# Patient Record
Sex: Male | Born: 1935 | Race: White | Hispanic: No | Marital: Married | Smoking: Former smoker
Health system: Southern US, Community
[De-identification: ages and names within clinical notes are randomized; demographics above are authoritative.]

## PROBLEM LIST (undated history)

## (undated) DIAGNOSIS — I10 Essential (primary) hypertension: Secondary | ICD-10-CM

## (undated) DIAGNOSIS — I6529 Occlusion and stenosis of unspecified carotid artery: Secondary | ICD-10-CM

## (undated) DIAGNOSIS — I2510 Atherosclerotic heart disease of native coronary artery without angina pectoris: Secondary | ICD-10-CM

## (undated) DIAGNOSIS — E785 Hyperlipidemia, unspecified: Secondary | ICD-10-CM

## (undated) HISTORY — DX: Occlusion and stenosis of unspecified carotid artery: I65.29

## (undated) HISTORY — PX: CARDIAC CATHETERIZATION: SHX172

## (undated) HISTORY — DX: Essential (primary) hypertension: I10

## (undated) HISTORY — PX: CORONARY ARTERY BYPASS GRAFT: SHX141

## (undated) HISTORY — PX: KNEE ARTHROPLASTY: SHX992

## (undated) HISTORY — DX: Atherosclerotic heart disease of native coronary artery without angina pectoris: I25.10

## (undated) HISTORY — DX: Hyperlipidemia, unspecified: E78.5

## (undated) HISTORY — PX: CAROTID ENDARTERECTOMY: SUR193

---

## 2009-11-15 ENCOUNTER — Inpatient Hospital Stay: Payer: Self-pay | Admitting: Internal Medicine

## 2009-11-15 ENCOUNTER — Ambulatory Visit: Payer: Self-pay | Admitting: Cardiovascular Disease

## 2009-11-16 ENCOUNTER — Encounter: Payer: Self-pay | Admitting: Cardiovascular Disease

## 2009-11-18 ENCOUNTER — Ambulatory Visit: Payer: Self-pay | Admitting: Cardiovascular Disease

## 2009-11-18 LAB — CONVERTED CEMR LAB
INR: 1.11 (ref <1.50)
Prothrombin Time: 14.2 s (ref 11.6–15.2)

## 2009-11-20 ENCOUNTER — Ambulatory Visit: Payer: Self-pay | Admitting: Cardiovascular Disease

## 2009-11-21 ENCOUNTER — Ambulatory Visit: Payer: Self-pay | Admitting: Cardiovascular Disease

## 2009-11-21 ENCOUNTER — Ambulatory Visit: Payer: Self-pay

## 2009-11-21 ENCOUNTER — Telehealth: Payer: Self-pay | Admitting: Cardiovascular Disease

## 2009-11-21 DIAGNOSIS — I25810 Atherosclerosis of coronary artery bypass graft(s) without angina pectoris: Secondary | ICD-10-CM

## 2009-11-21 DIAGNOSIS — R0989 Other specified symptoms and signs involving the circulatory and respiratory systems: Secondary | ICD-10-CM

## 2009-11-21 DIAGNOSIS — I6529 Occlusion and stenosis of unspecified carotid artery: Secondary | ICD-10-CM

## 2009-11-22 ENCOUNTER — Ambulatory Visit: Payer: Self-pay | Admitting: Vascular Surgery

## 2009-11-25 ENCOUNTER — Ambulatory Visit: Payer: Self-pay | Admitting: Cardiothoracic Surgery

## 2009-11-26 ENCOUNTER — Ambulatory Visit (HOSPITAL_COMMUNITY): Admission: RE | Admit: 2009-11-26 | Discharge: 2009-11-26 | Payer: Self-pay | Admitting: Cardiothoracic Surgery

## 2009-11-26 ENCOUNTER — Encounter: Payer: Self-pay | Admitting: Cardiothoracic Surgery

## 2009-11-28 ENCOUNTER — Inpatient Hospital Stay (HOSPITAL_COMMUNITY): Admission: RE | Admit: 2009-11-28 | Discharge: 2009-12-09 | Payer: Self-pay | Admitting: Cardiothoracic Surgery

## 2009-11-28 ENCOUNTER — Ambulatory Visit: Payer: Self-pay | Admitting: Cardiothoracic Surgery

## 2009-11-28 ENCOUNTER — Encounter: Payer: Self-pay | Admitting: Cardiothoracic Surgery

## 2009-12-17 ENCOUNTER — Ambulatory Visit: Payer: Self-pay | Admitting: Cardiovascular Disease

## 2009-12-17 DIAGNOSIS — E785 Hyperlipidemia, unspecified: Secondary | ICD-10-CM | POA: Insufficient documentation

## 2009-12-25 ENCOUNTER — Ambulatory Visit: Payer: Self-pay | Admitting: Cardiothoracic Surgery

## 2009-12-25 ENCOUNTER — Encounter: Admission: RE | Admit: 2009-12-25 | Discharge: 2009-12-25 | Payer: Self-pay | Admitting: Cardiothoracic Surgery

## 2009-12-31 ENCOUNTER — Telehealth: Payer: Self-pay | Admitting: Cardiovascular Disease

## 2010-01-14 ENCOUNTER — Telehealth: Payer: Self-pay | Admitting: Cardiovascular Disease

## 2010-01-29 ENCOUNTER — Telehealth: Payer: Self-pay | Admitting: Cardiovascular Disease

## 2010-02-04 ENCOUNTER — Ambulatory Visit: Payer: Self-pay | Admitting: Vascular Surgery

## 2010-02-19 ENCOUNTER — Ambulatory Visit: Payer: Self-pay | Admitting: Cardiothoracic Surgery

## 2010-03-03 ENCOUNTER — Encounter: Payer: Self-pay | Admitting: Cardiovascular Disease

## 2010-03-10 ENCOUNTER — Telehealth: Payer: Self-pay | Admitting: Cardiovascular Disease

## 2010-04-28 ENCOUNTER — Ambulatory Visit: Payer: Self-pay | Admitting: Cardiovascular Disease

## 2010-04-30 ENCOUNTER — Ambulatory Visit: Payer: Self-pay | Admitting: Cardiovascular Disease

## 2010-05-05 LAB — CONVERTED CEMR LAB
Cholesterol: 118 mg/dL (ref 0–200)
Eosinophils Absolute: 0.2 10*3/uL (ref 0.0–0.7)
HCT: 40.6 % (ref 39.0–52.0)
HDL: 24 mg/dL — ABNORMAL LOW (ref >39)
Hemoglobin: 13 g/dL (ref 13.0–17.0)
LDL Cholesterol: 62 mg/dL (ref 0–99)
MCHC: 32 g/dL (ref 30.0–36.0)
Monocytes Absolute: 0.5 10*3/uL (ref 0.1–1.0)
Neutro Abs: 1.4 10*3/uL — ABNORMAL LOW (ref 1.7–7.7)
Neutrophils Relative %: 39 % — ABNORMAL LOW (ref 43–77)
WBC: 3.5 10*3/uL — ABNORMAL LOW (ref 4.0–10.5)

## 2010-05-28 ENCOUNTER — Encounter: Payer: Self-pay | Admitting: Cardiovascular Disease

## 2010-05-29 ENCOUNTER — Ambulatory Visit: Payer: Self-pay

## 2010-06-12 ENCOUNTER — Telehealth: Payer: Self-pay | Admitting: Cardiovascular Disease

## 2010-06-12 ENCOUNTER — Encounter: Payer: Self-pay | Admitting: Cardiovascular Disease

## 2010-07-15 NOTE — Assessment & Plan Note (Signed)
Summary: NP6/AMD   Visit Type:  Follow-up Primary Provider:  [place] Reid, MD  CC:  needs CABG.  History of Present Illness: [name] [name] is a 75 year old gentleman who presents as an add-on to the clinic to discuss the results of his carotid ultrasound.  He was recently evaluated at  [facility] last week with chest pain. He had a non-ST elevation MI and was brought to the cardiac catheterization lab showing  disease, including LAD, ostial diagonal disease x2 and he was referred for bypass surgery. He is due to see the cardiac surgeon on June 13 at 2:30 pm.  In preparation for this surgery, we had him perform a carotid ultrasound today that showed severe disease of the left internal carotid artery estimated at 80-99%. There is significant soft plaque this region. Velocity measurements were significantly elevated. He has been asymptomatic with no further episodes of chest pain, lightheadedness or dizziness.    Preventive Screening-Counseling & Management  Alcohol-Tobacco     Smoking Status: quit  Caffeine-Diet-Exercise     Does Patient Exercise: yes      Drug Use:  no.    Current Medications (verified): 1)  Glipizide 10 Mg Tabs (Glipizide) .... 4 Daily 2)  Omeprazole 20 Mg Cpdr (Omeprazole) .... Once Daily 3)  Lisinopril 20 Mg Tabs (Lisinopril) .... Take 1/2 Tablet By Mouth Daily 4)  Hydroxyzine Hcl 10 Mg Tabs (Hydroxyzine Hcl) .... Once Daily 5)  Actos 45 Mg Tabs (Pioglitazone Hcl) .... Once Daily 6)  Simvastatin 20 Mg Tabs (Simvastatin) .... Take 2 Tablet By Mouth Daily At Bedtime 7)  Ropinirole Hcl 0.25 Mg Tabs (Ropinirole Hcl) .... At Bedtime 8)  Terazosin Hcl 1 Mg Caps (Terazosin Hcl) .... 2 At Bedtime 9)  Aspirin Ec 325 Mg Tbec (Aspirin) .... Take One Tablet By Mouth Daily 10)  Metoprolol Tartrate 50 Mg Tabs (Metoprolol Tartrate) .... Take One Tablet By Mouth Twice A Day  Allergies (verified): No Known Drug Allergies  Past History:  Past Surgical  History: tumor off R kidney lens replacement - bilateral Knee Arthroplasty-Total - bilateral  Family History: Family History of Coronary Artery Disease:  Family History of CVA or Stroke:  Family History of Depression:  Family History of Diabetes:  Family History of Hyperlipidemia:  Family History of Hypertension:  Family History of Renal Disease:  Family History of Seizure Disorder:  Family History of Thyroid Disease:   Social History: Retired  Single  Married  Tobacco Use - Former.  1990 or before Alcohol Use - no -- used to drink beer Regular Exercise - yes -- walk Drug Use - no Smoking Status:  quit Does Patient Exercise:  yes Drug Use:  no  Review of Systems  The patient denies fever, weight loss, weight gain, vision loss, decreased hearing, hoarseness, chest pain, syncope, dyspnea on exertion, peripheral edema, prolonged cough, abdominal pain, incontinence, muscle weakness, depression, and enlarged lymph nodes.    Vital Signs:  Patient profile:   75 year old male Height:      67 inches Weight:      197 pounds BMI:     30.97 Pulse rate:   75 / minute BP sitting:   90 / 54  (right arm) Cuff size:   regular  Vitals Entered By: Hardin Negus, RMA (November 21, 2009 4:32 PM)  Physical Exam  General:  Well developed, well nourished, in no acute distress.full exam was not completed as he was seen for his carotid U/S results.  Impression & Recommendations:  Problem # 1:  CORONARY ARTERY DISEASE (ICD-414.00) He is due to see TCTS on Monday for evaluation for CABG. He has a strong family history of coronary artery disease. He did receive a Plavix load of 300 mg at the time of his discharge though the hospitalist service did not prescribe him Plavix maintenance dose and he has not been on any Plavix this week.  His blood pressure is borderline low today and we have asked him to monitor his pressures at home.  His updated medication list for this problem includes:     Lisinopril 20 Mg Tabs (Lisinopril) .Marland Kitchen... Take 1/2 tablet by mouth daily    Aspirin Ec 325 Mg Tbec (Aspirin) .Marland Kitchen... Take one tablet by mouth daily    Metoprolol Tartrate 50 Mg Tabs (Metoprolol tartrate) .Marland Kitchen... Take one tablet by mouth twice a day  Orders: TCTS Referral (TCTS Ref)  Problem # 2:  CAROTID ARTERY OCCLUSION (ICD-433.10) severe disease of his left internal carotid artery estimated at 80-99%. Lesion appears to be  soft plaque with significant calcified plaque leading up to the lesion.  We talked with Dr. Dorris Fetch concerning his newly identified carotid disease. We will provide the images performed today of his carotids for vascular surgery to evaluate. We will defer to  TCTS as to the timing of whether a CABG and carotid are performed at the same time or in sequence.  His updated medication list for this problem includes:    Aspirin Ec 325 Mg Tbec (Aspirin) .Marland Kitchen... Take one tablet by mouth daily  Orders: [facility] Referral ([facility] Ref)  Patient Instructions: 1)  Your physician recommends that you schedule a follow-up appointment in: 3 months  2)  Your physician recommends that you continue on your current medications as directed. Please refer to the Current Medication list given to you today. 3)  You have been referred to Vascular Surgeon

## 2010-07-15 NOTE — Assessment & Plan Note (Signed)
Summary: POST CABG/ALT   Visit Type:  Follow-up Primary Provider:  [place] Reid, MD  CC:  Decrease in BP. Marland Kitchen  History of Present Illness: [name] [name] is a 75 year old gentleman with a hx of non-ST elevation MI and was brought to the cardiac catheterization lab showing  disease, including LAD, ostial diagonal disease x2 and he was referred for bypass surgery. He had a CABG x5 with a carotid endarterectomy on the left simultaneously with Dr. Morton Peters and Dr. Arbie Cookey at [facility].  He presents today reporting that he feels significantly better. He does have significant back discomfort, bilateral nerve type pain from his elbow down his fourth and fifth fingers. He feels a little bit tired, is taking frequent naps. Has not had problems with shortness of breath or significant lower extremity edema on the left. He has had some edema on the right. His mouth is dry and has thick saliva, though this has been a chronic problem. He is running out of Keflex as the [place] gave him an incorrect prescription. He does continue to have urinary burning and believes he still has the infection. He reports only having a Foley catheter for 2 days on the day of the surgery and the day after.  Carotid U/S:  severe disease of the left internal carotid artery estimated at 80-99%, now s/p CEA    Current Medications (verified): 1)  Glipizide 10 Mg Tabs (Glipizide) .... Two Tablets Two Times A Day 2)  Omeprazole 20 Mg Cpdr (Omeprazole) .... Once Daily 3)  Hydroxyzine Hcl 10 Mg Tabs (Hydroxyzine Hcl) .... Once Daily 4)  Actos 30 Mg Tabs (Pioglitazone Hcl) .Marland Kitchen.. 1 Once Daily 5)  Ropinirole Hcl 0.25 Mg Tabs (Ropinirole Hcl) .... At Bedtime 6)  Terazosin Hcl 1 Mg Caps (Terazosin Hcl) .Marland Kitchen.. 1 At Bedtime 7)  Aspirin Ec 325 Mg Tbec (Aspirin) .... Take One Tablet By Mouth Daily 8)  Metoprolol Tartrate 50 Mg Tabs (Metoprolol Tartrate) .... Take One Tablet By Mouth Twice A Day 9)  Amiodarone Hcl 200 Mg Tabs (Amiodarone Hcl) .Marland Kitchen.. 1 Once  Daily 10)  Furosemide 40 Mg Tabs (Furosemide) .Marland Kitchen.. 1 Once Daily As Needed 11)  Keflex 500 Mg Caps (Cephalexin) .Marland Kitchen.. 1 Two Times A Day 12)  Docusate Sodium 100 Mg Caps (Docusate Sodium) .Marland Kitchen.. 1 Qd 13)  Crestor 10 Mg Tabs (Rosuvastatin Calcium) .Marland Kitchen.. 1 At Bedtime 14)  Vitamin B-12 Cr 2000 Mcg Cr-Tabs (Cyanocobalamin) .Marland Kitchen.. 1 Once Daily 15)  Vitamin D3 2000 Unit Caps (Cholecalciferol) .Marland Kitchen.. 1 Once Daily 16)  Potassium Chloride Crys Cr 20 Meq Cr-Tabs (Potassium Chloride Crys Cr) .Marland Kitchen.. 1 Once Daily 17)  Tylenol With Codeine #3 300-30 Mg Tabs (Acetaminophen-Codeine) .... As Needed  Allergies (verified): No Known Drug Allergies  Past History:  Family History: Last updated: 11/21/2009 Family History of Coronary Artery Disease:  Family History of CVA or Stroke:  Family History of Depression:  Family History of Diabetes:  Family History of Hyperlipidemia:  Family History of Hypertension:  Family History of Renal Disease:  Family History of Seizure Disorder:  Family History of Thyroid Disease:   Social History: Last updated: 11/21/2009 Retired  Single  Married  Tobacco Use - Former.  1990 or before Alcohol Use - no -- used to drink beer Regular Exercise - yes -- walk Drug Use - no  Risk Factors: Exercise: yes (11/21/2009)  Risk Factors: Smoking Status: quit (11/21/2009)  Past Surgical History: tumor off R kidney lens replacement - bilateral Knee Arthroplasty-Total - bilateral CABG x  5 @ [facility]  Review of Systems       The patient complains of dyspnea on exertion.  The patient denies fever, weight loss, weight gain, vision loss, decreased hearing, hoarseness, chest pain, prolonged cough, abdominal pain, incontinence, muscle weakness, depression, and enlarged lymph nodes.         edem of the right leg, weak  Vital Signs:  Patient profile:   76 year old male Height:      67 inches Weight:      200 pounds BMI:     31.44 Pulse rate:   77 / minute BP sitting:    94 / 58  (right arm) Cuff size:   regular  Vitals Entered By: Bishop Dublin, CMA (December 17, 2009 11:27 AM)  Physical Exam  General:  Well developed, well nourished, in no acute distress. Head:  normocephalic and atraumatic Neck:  Neck supple, no JVD. No masses, thyromegaly or abnormal cervical nodes.healing scar over the left carotid Chest Wall:  no deformities or breast masses noted Lungs:  Clear bilaterally to auscultation and percussion. Heart:  Non-displaced PMI, chest non-tender; regular rate and rhythm, S1, S2 without murmurs, rubs or gallops. Carotid upstroke normal, no bruit.  Pedals normal pulses. 1+  edema on the right LE, no varicosities. Abdomen:  Bowel sounds positive; abdomen soft and non-tender without masses Msk:  Back normal, normal gait. Muscle strength and tone normal. Pulses:  pulses normal in all 4 extremities Extremities:  No clubbing or cyanosis. Neurologic:  Alert and oriented x 3. Skin:  Intact without lesions or rashes. Psych:  Normal affect.   Impression & Recommendations:  Problem # 1:  CORONARY ARTERY DISEASE (ICD-414.00) severe coronary artery disease as seen on cardiac catheterization, status post bypass surgery x5. Postoperatively, appears to be recovering relatively well. Blood pressure is borderline low today but he states it has been systolic 110 at home on a regular basis. He does not appear dehydrated, he uses his Lasix only p.r.n. I last him with his family to monitor his blood pressure and to continue his current medications at this time  He does report having some pain on urination and is still on antibiotics for a urinary tract infection. We will wait and check a urinalysis or culture either with Dr. Morton Peters in 7 days' time or we could do it on his way home from that visit as he drives back to [place].   The following medications were removed from the medication list:    Lisinopril 20 Mg Tabs (Lisinopril) .Marland Kitchen... Take 1/2 tablet by mouth  daily His updated medication list for this problem includes:    Aspirin Ec 325 Mg Tbec (Aspirin) .Marland Kitchen... Take one tablet by mouth daily    Metoprolol Tartrate 50 Mg Tabs (Metoprolol tartrate) .Marland Kitchen... Take one tablet by mouth twice a day  Orders: EKG w/ Interpretation (93000)  Problem # 2:  CAROTID ARTERY OCCLUSION (ICD-433.10) Recent ultrasound showing severe carotid arterial disease, status post carotid endarterectomy on the left.  His updated medication list for this problem includes:    Aspirin Ec 325 Mg Tbec (Aspirin) .Marland Kitchen... Take one tablet by mouth daily  Problem # 3:  HYPERLIPIDEMIA-MIXED (ICD-272.4) He is currently on Crestor and we will check his cholesterol in 2 months time to make sure that he is at goal with LDL less than 70.  The following medications were removed from the medication list:    Simvastatin 20 Mg Tabs (Simvastatin) .Marland Kitchen... Take 2 tablet by  mouth daily at bedtime His updated medication list for this problem includes:    Crestor 10 Mg Tabs (Rosuvastatin calcium) .Marland Kitchen... 1 at bedtime  Patient Instructions: 1)  Your physician recommends that you schedule a follow-up appointment in: 3 months  2)  Your physician has recommended you make the following change in your medication: Keflex 500mg  two times a day for 4 days  Prescriptions: KEFLEX 500 MG CAPS (CEPHALEXIN) Take 1 tablet by mouth two times a day for 4 days  #8 x 0   Entered by:   Benedict Needy, RN   Authorized by:   Dossie Arbour MD   Signed by:   Benedict Needy, RN on 12/17/2009   Method used:   Electronically to        [facility] [address] (retail)       [address], [address]       [place], [place]  [address]       Ph: [phone]       Fax: [phone]   RxID:   [id number]

## 2010-07-15 NOTE — Letter (Signed)
[facility]   Imported By: Harlon Flor 11/19/2009 12:14:38  _____________________________________________________________________  External Attachment:    Type:   Image     Comment:   External Document

## 2010-07-15 NOTE — Miscellaneous (Signed)
  Clinical Lists Changes  Problems: Added new problem of OTHER SYMPTOMS INVOLVING CARDIOVASCULAR SYSTEM (ICD-785.9) Orders: Added new Test order of Carotid Duplex (Carotid Duplex) - Signed

## 2010-07-15 NOTE — Progress Notes (Signed)
Summary: REFILL Metoprolol Tart.  Phone Note Refill Request   Refills Requested: Medication #1:  ACTOS 30 MG TABS 1 once daily   Notes: Need to contact PCP  Medication #2:  METOPROLOL TARTRATE 50 MG TABS Take one tablet by mouth twice a day   Dosage confirmed as above?Dosage Confirmed   Supply Requested: 6 months [facility] [address]  Initial call taken by: West Carbo,  December 31, 2009 9:30 AM  Follow-up for Phone Call        Rx denied because need to contact PCP for Actos refill. Follow-up by: Bishop Dublin, CMA,  December 31, 2009 10:11 AM    Prescriptions: METOPROLOL TARTRATE 50 MG TABS (METOPROLOL TARTRATE) Take one tablet by mouth twice a day  #60 x 6   Entered by:   Bishop Dublin, CMA   Authorized by:   Dossie Arbour MD   Signed by:   Bishop Dublin, CMA on 12/31/2009   Method used:   Electronically to        [facility] [address] (retail)       [address], [address]       [place], [place]  [address]       Ph: [phone]       Fax: [phone]   RxID:   [id number]

## 2010-07-15 NOTE — Progress Notes (Signed)
Summary: MEDICATIONS  Phone Note Call from Patient Call back at Home Phone [phone]   Caller: Patient Call For: [facility] Summary of Call: PT WOULD LIKE TO TALK WITH YOU ABOUT DIABETES MEDICATIONS THAT HE IS ON. PLEASE CALL HIM AT [phone]. [facility] TOLD [facility] TO STOP TAKING MEDICATION BECAUSE IT WAS DANGEROUS AND THEY WILL NOT REFILL Initial call taken by: [place] Carbo,  January 14, 2010 1:27 PM  Follow-up for Phone Call        [facility] will not [facility] actos for pt she stated that it was a dangerous medication.  Pt just wanted to let us know.  Follow-up by: Benedict Needy, RN,  January 14, 2010 2:46 PM

## 2010-07-15 NOTE — Letter (Signed)
Summary: [facility] Fax  [facility] Fax   Imported By: Marylou Mccoy 03/19/2010 11:31:44  _____________________________________________________________________  External Attachment:    Type:   Image     Comment:   External Document

## 2010-07-15 NOTE — Op Note (Signed)
Summary: Consent for Cardiac Catherization  Consent for Cardiac Catherization   Imported By: Harlon Flor 11/22/2009 16:30:50  _____________________________________________________________________  External Attachment:    Type:   Image     Comment:   External Document

## 2010-07-15 NOTE — Letter (Signed)
Summary: Cardiac Catheterization Instructions- Main Lab  [facility] [address]   [place], [place] [address]   Phone: [phone]  Fax: [phone]     11/18/2009 MRN: [id number]  [facility] [address] [place], [place]  [address]  Dear [name] [name],   You are scheduled for Cardiac Catheterization on   June 8th       with Dr.  Mariah Milling.  Please arrive at the [facility] at  7 a.m. on the day of your procedure.  1. DIET     __x__ Nothing to eat or drink after midnight except your medications with a sip of water.  2. Come to the [place] office on       11/18/09    for lab work.    3. MAKE SURE YOU TAKE YOUR ASPIRIN.  4. ___x__ DO NOT TAKE these medications before your procedure  Actos or Glipizide       ___x_ YOU MAY TAKE ALL of your remaining medications with a small amount of water.  5. Plan for one night stay - bring personal belongings (i.e. toothpaste, toothbrush, etc.)  6. Bring a current list of your medications and current insurance cards.  7. Must have a responsible person to drive you home.   8. Someone must be with you for the first 24 hours after you arrive home.  9. Please wear clothes that are easy to get on and off and wear slip-on shoes.  *Special note: Every effort is made to have your procedure done on time.  Occasionally there are emergencies that present themselves at the hospital that may cause delays.  Please be patient if a delay does occur.  If you have any questions after you get home, please call the office at the number listed above.  Benedict Needy, RN

## 2010-07-15 NOTE — Progress Notes (Signed)
Summary: PLEASE CALL  Phone Note Call from Patient Call back at Home Phone [phone]   Caller: WIFE (PATSY) Call For: [facility] Summary of Call: WIFE WOULD LIKE A CALL FROM DR Mariah Milling ABOUT THE PT'S CONDITION-SHE STATES THAT HE IS NOT ACTING NORMAL WITH THE MEDICATIONS THAT HE IS ON Initial call taken by: Harlon Flor,  January 29, 2010 3:59 PM  Follow-up for Phone Call        [facility] Benedict Needy, RN  January 29, 2010 5:01 PM   spoke to pt's wife.  the pt is in [place] with childern now but he has been very angry.  Wife reports that isn't normal for him. Pt has not seen PCP lately but is due to be seen. Also questioned wife about if pt has been check for UTI lately since that was a problem at the time of his CABG.  She will ask the pt about it.    Follow-up by: Benedict Needy, RN,  January 30, 2010 10:21 AM

## 2010-07-15 NOTE — Assessment & Plan Note (Signed)
Summary: F/U 3 MONTHS   Visit Type:  Follow-up Primary Jadwiga Faidley:  [name] Reid, MD  CC:  sluggish.  History of Present Illness: [name] [name] is a 75 year old gentleman with a hx of non-ST elevation MI and was brought to the cardiac catheterization lab showing  disease, including LAD, ostial diagonal disease x2 and he was referred for bypass surgery. He had a CABG x5 with a carotid endarterectomy on the left simultaneously with Dr. Morton Peters and Dr. Arbie Cookey at [facility]. He presents for routine followup.  he states that he did not participate in physical therapy. He was sick during that time. He currently walks several times per week, at least half a mile a day. He does feel fatigue and his wife states that he has been pale. He does have a very small cough. He reports having red urine that seems to come and go and wonders if he might be leaking blood. He does have several prostate medications that he takes reports that his blood pressure has been borderline low.  his diabetes has been slowly improving with previous sugars of 230, now 174 on higher dose insulin.  Carotid U/S:  severe disease of the left internal carotid artery estimated at 80-99%, now s/p CEA      Current Medications (verified): 1)  Glipizide 10 Mg Tabs (Glipizide) .... Two Tablets Two Times A Day 2)  Omeprazole 20 Mg Cpdr (Omeprazole) .... Once Daily 3)  Hydroxyzine Hcl 10 Mg Tabs (Hydroxyzine Hcl) .... Two Times A Day As Needed 4)  Ropinirole Hcl 0.5 Mg Tabs (Ropinirole Hcl) .... At Bedtime 5)  Terazosin Hcl 1 Mg Caps (Terazosin Hcl) .Marland Kitchen.. 1 At Bedtime 6)  Aspirin Ec 325 Mg Tbec (Aspirin) .... Take One Tablet By Mouth Daily 7)  Metoprolol Tartrate 50 Mg Tabs (Metoprolol Tartrate) .... Take 1/2 Tablet By Mouth Twice A Day 8)  Amiodarone Hcl 200 Mg Tabs (Amiodarone Hcl) .... Take 1 Tablet By Mouth Once A Day 9)  Furosemide 40 Mg Tabs (Furosemide) .Marland Kitchen.. 1 Once Daily As Needed 10)  Docusate Sodium 100 Mg Caps (Docusate Sodium)  .Marland Kitchen.. 1 Once Daily As Needed 11)  Crestor 10 Mg Tabs (Rosuvastatin Calcium) .Marland Kitchen.. 1 At Bedtime 12)  Vitamin B-12 Cr 2000 Mcg Cr-Tabs (Cyanocobalamin) .Marland Kitchen.. 1 Once Daily 13)  Vitamin D3 2000 Unit Caps (Cholecalciferol) .Marland Kitchen.. 1 Once Daily 14)  Tylenol With Codeine #3 300-30 Mg Tabs (Acetaminophen-Codeine) .... As Needed 15)  Flomax 0.4 Mg Caps (Tamsulosin Hcl) .... Once Daily 16)  Finasteride 5 Mg Tabs (Finasteride) .... At Bedtime  Allergies (verified): No Known Drug Allergies  Past History:  Past Surgical History: Last updated: 12/17/2009 tumor off R kidney lens replacement - bilateral Knee Arthroplasty-Total - bilateral CABG x 5 @ [facility] End[facility]ctomy  Family History: Last updated: 11/21/2009 Family History of Coronary Artery Disease:  Family History of CVA or Stroke:  Family History of Depression:  Family History of Diabetes:  Family History of Hyperlipidemia:  Family History of Hypertension:  Family History of Renal Disease:  Family History of Seizure Disorder:  Family History of Thyroid Disease:   Social History: Last updated: 11/21/2009 Retired  Single  Married  Tobacco Use - Former.  1990 or before Alcohol Use - no -- used to drink beer Regular Exercise - yes -- walk Drug Use - no  Risk Factors: Exercise: yes (11/21/2009)  Risk Factors: Smoking Status: quit (11/21/2009)  Review of Systems  The patient denies fever, weight loss, weight gain, vision loss, decreased  hearing, hoarseness, chest pain, syncope, dyspnea on exertion, peripheral edema, prolonged cough, abdominal pain, incontinence, muscle weakness, depression, and enlarged lymph nodes.         Fatigue, pale  Vital Signs:  Patient profile:   75 year old male Height:      67 inches Weight:      187 pounds BMI:     29.39 Pulse rate:   76 / minute BP sitting:   100 / 62  (left arm) Cuff size:   regular  Vitals Entered By: Hardin Negus, RMA (April 28, 2010 11:12 AM)  Physical  Exam  General:  Well developed, well nourished, in no acute distress. appears somewhat pale Head:  normocephalic and atraumatic Neck:  Neck supple, no JVD. No masses, thyromegaly or abnormal cervical nodes.healing scar over the left carotid Lungs:  Clear bilaterally to auscultation and percussion. Heart:  Non-displaced PMI, chest non-tender; regular rate and rhythm, S1, S2 without murmurs, rubs or gallops. Carotid upstroke normal, no bruit.  Pedals normal pulses. no  LE edema, no varicosities. Abdomen:  Bowel sounds positive; abdomen soft and non-tender without masses Msk:  Back normal, normal gait. Muscle strength and tone normal. Pulses:  pulses normal in all 4 extremities Extremities:  No clubbing or cyanosis. Neurologic:  Alert and oriented x 3. Skin:  Intact without lesions or rashes. Psych:  Normal affect.   Impression & Recommendations:  Problem # 1:  CORONARY ARTERY DISEASE (ICD-414.00) no symptoms of angina at this time. He does have fatigue and I'm concerned about anemia. We'll check some blood work this week. No further testing needed.  His updated medication list for this problem includes:    Aspirin Ec 325 Mg Tbec (Aspirin) .Marland Kitchen... Take one tablet by mouth daily    Metoprolol Tartrate 50 Mg Tabs (Metoprolol tartrate) .Marland Kitchen... Take 1/2 tablet by mouth twice a day  Problem # 2:  CAROTID ARTERY OCCLUSION (ICD-433.10) Carotid endarterectomy on the left. Continue aspirin 81 mg x2. Aggressive lipid control.  His updated medication list for this problem includes:    Aspirin Ec 325 Mg Tbec (Aspirin) .Marland Kitchen... Take one tablet by mouth daily  Problem # 3:  HYPERLIPIDEMIA-MIXED (ICD-272.4) He is currently on Crestor 5 mg daily. We will check his cholesterol this week. Goal LDL is less than 70. We have also asked him to be more aggressive with his diabetes.  His updated medication list for this problem includes:    Crestor 10 Mg Tabs (Rosuvastatin calcium) .Marland Kitchen... 1 at  bedtime  Patient Instructions: 1)  Your physician recommends that you schedule a follow-up appointment in: 6 months 2)  Your physician recommends that you return for lab work in: LIV/LIP, CBC on Wed. 04/30/2010.

## 2010-07-15 NOTE — Progress Notes (Signed)
  Phone Note Outgoing Call   Call placed by: Benedict Needy Call placed to: Melissa(daughter) Summary of Call: Pt called with Dr. Windell Hummingbird recommendations to keep appointment with Dr. Morton Peters. Daughter in agreement.  Address and practice name given.

## 2010-07-15 NOTE — Progress Notes (Signed)
Summary: MEDICATION  Phone Note Call from Patient Call back at Home Phone [phone]   Caller: SELF Call For: [facility] Summary of Call: WOULD LIKE TO KNOW HOW LONG HE IS SUPPOSED TO TAKE PACERONE Initial call taken by: Harlon Flor,  March 10, 2010 10:57 AM  Follow-up for Phone Call        The number left by pt has been disconnected. Benedict Needy, RN  March 10, 2010 2:38 PM   Pt called with new # [phone] Benedict Needy, RN  March 11, 2010 12:01 PM   Pt needed refil on amiodarone Benedict Needy, RN  March 11, 2010 2:21 PM     New/Updated Medications: AMIODARONE HCL 200 MG TABS (AMIODARONE HCL) Take 1 tablet by mouth once a day Prescriptions: AMIODARONE HCL 200 MG TABS (AMIODARONE HCL) Take 1 tablet by mouth once a day  #30 x 6   Entered by:   Benedict Needy, RN   Authorized by:   Dossie Arbour MD   Signed by:   Benedict Needy, RN on 03/11/2010   Method used:   Electronically to        [facility]  [address] (retail)       [address]       [place], [place]  [address]       Ph: [phone]       Fax: [phone]   RxID:   [id number]

## 2010-07-15 NOTE — Cardiovascular Report (Signed)
Summary: Cath Order  Cath Order   Imported By: Harlon Flor 11/20/2009 09:52:44  _____________________________________________________________________  External Attachment:    Type:   Image     Comment:   External Document

## 2010-07-17 NOTE — Miscellaneous (Signed)
Summary: Orders Update  Clinical Lists Changes  Orders: Added new Test order of Carotid Duplex (Carotid Duplex) - Signed 

## 2010-07-17 NOTE — Progress Notes (Signed)
Summary: Pt sluggish/pale  Phone Note Other Incoming Call back at [phone]   Caller: Melissa Summary of Call: Efraim Kaufmann called wanting to relay message to Dr.Gollan. Pt had open heart surgery 6 months ago and feels sluggish,looks pale. [facility]  [place] [facility]ed[facility] and unsure what the results were. pt is coming in today for a ultrasound.  Initial call taken by: Lysbeth Galas CMA,  June 12, 2010 1:03 PM  Follow-up for Phone Call        on last visit 04/2010, he looked pale and blood count was ok. Can patient request a copy of the labs from the  [place]? Is he exercising? Does he need to go to cardiac rehab? PMD could also check thyroid, testosterone level.     Appended Document: Pt sluggish/pale Attempted to call Melissa back LMOM TCB /MES  Appended Document: Pt sluggish/pale Attempted to call Melissa, LMOM TCB /MES  Appended Document: Pt sluggish/pale Spoke to [place], RN, she states pt was taking Lasix two times a day when his order is for Lasix once daily as needed. Melissa re-educated on correct use of medication. She had instructed pt to stop Lasix and only take as needed for swelling. Pt's color is better and energy has returned since he stopped. Melissa will contact our office if any change in status, otherwise this is the cause of pt's symptoms. Pt will request labs from [place]. /MES

## 2010-08-05 ENCOUNTER — Other Ambulatory Visit: Payer: Self-pay

## 2010-08-05 ENCOUNTER — Ambulatory Visit: Payer: Self-pay | Admitting: Vascular Surgery

## 2010-08-27 ENCOUNTER — Telehealth: Payer: Self-pay | Admitting: Cardiovascular Disease

## 2010-08-31 LAB — POCT I-STAT 3, ART BLOOD GAS (G3+)
Acid-base deficit: 1 mmol/L (ref 0.0–2.0)
Acid-base deficit: 2 mmol/L (ref 0.0–2.0)
Acid-base deficit: 2 mmol/L (ref 0.0–2.0)
Acid-base deficit: 3 mmol/L — ABNORMAL HIGH (ref 0.0–2.0)
Acid-base deficit: 3 mmol/L — ABNORMAL HIGH (ref 0.0–2.0)
Acid-base deficit: 4 mmol/L — ABNORMAL HIGH (ref 0.0–2.0)
Acid-base deficit: 4 mmol/L — ABNORMAL HIGH (ref 0.0–2.0)
Acid-base deficit: 5 mmol/L — ABNORMAL HIGH (ref 0.0–2.0)
Acid-base deficit: 5 mmol/L — ABNORMAL HIGH (ref 0.0–2.0)
Bicarbonate: 20.4 mEq/L (ref 20.0–24.0)
Bicarbonate: 21.4 mEq/L (ref 20.0–24.0)
Bicarbonate: 21.7 mEq/L (ref 20.0–24.0)
Bicarbonate: 21.9 mEq/L (ref 20.0–24.0)
Bicarbonate: 22.2 mEq/L (ref 20.0–24.0)
Bicarbonate: 22.2 mEq/L (ref 20.0–24.0)
Bicarbonate: 23.4 mEq/L (ref 20.0–24.0)
Bicarbonate: 23.6 mEq/L (ref 20.0–24.0)
Bicarbonate: 24.8 mEq/L — ABNORMAL HIGH (ref 20.0–24.0)
O2 Saturation: 100 %
O2 Saturation: 86 %
O2 Saturation: 89 %
O2 Saturation: 91 %
O2 Saturation: 91 %
O2 Saturation: 93 %
O2 Saturation: 93 %
O2 Saturation: 97 %
O2 Saturation: 99 %
Patient temperature: 37
Patient temperature: 37.8
Patient temperature: 97
Patient temperature: 98.8
Patient temperature: 99.2
Patient temperature: 99.3
Patient temperature: 99.9
Patient temperature: 99.9
TCO2: 21 mmol/L (ref 0–100)
TCO2: 23 mmol/L (ref 0–100)
TCO2: 23 mmol/L (ref 0–100)
TCO2: 23 mmol/L (ref 0–100)
TCO2: 23 mmol/L (ref 0–100)
TCO2: 23 mmol/L (ref 0–100)
TCO2: 25 mmol/L (ref 0–100)
TCO2: 25 mmol/L (ref 0–100)
TCO2: 26 mmol/L (ref 0–100)
pCO2 arterial: 38.2 mmHg (ref 35.0–45.0)
pCO2 arterial: 38.5 mmHg (ref 35.0–45.0)
pCO2 arterial: 40.1 mmHg (ref 35.0–45.0)
pCO2 arterial: 42.2 mmHg (ref 35.0–45.0)
pCO2 arterial: 42.4 mmHg (ref 35.0–45.0)
pCO2 arterial: 42.5 mmHg (ref 35.0–45.0)
pCO2 arterial: 43.2 mmHg (ref 35.0–45.0)
pCO2 arterial: 43.6 mmHg (ref 35.0–45.0)
pCO2 arterial: 44.7 mmHg (ref 35.0–45.0)
pH, Arterial: 7.287 — ABNORMAL LOW (ref 7.350–7.450)
pH, Arterial: 7.314 — ABNORMAL LOW (ref 7.350–7.450)
pH, Arterial: 7.333 — ABNORMAL LOW (ref 7.350–7.450)
pH, Arterial: 7.335 — ABNORMAL LOW (ref 7.350–7.450)
pH, Arterial: 7.336 — ABNORMAL LOW (ref 7.350–7.450)
pH, Arterial: 7.342 — ABNORMAL LOW (ref 7.350–7.450)
pH, Arterial: 7.356 (ref 7.350–7.450)
pH, Arterial: 7.369 (ref 7.350–7.450)
pH, Arterial: 7.374 (ref 7.350–7.450)
pO2, Arterial: 142 mmHg — ABNORMAL HIGH (ref 80.0–100.0)
pO2, Arterial: 377 mmHg — ABNORMAL HIGH (ref 80.0–100.0)
pO2, Arterial: 57 mmHg — ABNORMAL LOW (ref 80.0–100.0)
pO2, Arterial: 61 mmHg — ABNORMAL LOW (ref 80.0–100.0)
pO2, Arterial: 65 mmHg — ABNORMAL LOW (ref 80.0–100.0)
pO2, Arterial: 67 mmHg — ABNORMAL LOW (ref 80.0–100.0)
pO2, Arterial: 69 mmHg — ABNORMAL LOW (ref 80.0–100.0)
pO2, Arterial: 74 mmHg — ABNORMAL LOW (ref 80.0–100.0)
pO2, Arterial: 95 mmHg (ref 80.0–100.0)

## 2010-08-31 LAB — CBC
HCT: 24.2 % — ABNORMAL LOW (ref 39.0–52.0)
HCT: 25.9 % — ABNORMAL LOW (ref 39.0–52.0)
HCT: 26 % — ABNORMAL LOW (ref 39.0–52.0)
HCT: 26.1 % — ABNORMAL LOW (ref 39.0–52.0)
HCT: 28.1 % — ABNORMAL LOW (ref 39.0–52.0)
HCT: 28.4 % — ABNORMAL LOW (ref 39.0–52.0)
HCT: 28.7 % — ABNORMAL LOW (ref 39.0–52.0)
HCT: 29.1 % — ABNORMAL LOW (ref 39.0–52.0)
HCT: 29.3 % — ABNORMAL LOW (ref 39.0–52.0)
HCT: 31.2 % — ABNORMAL LOW (ref 39.0–52.0)
Hemoglobin: 10 g/dL — ABNORMAL LOW (ref 13.0–17.0)
Hemoglobin: 10 g/dL — ABNORMAL LOW (ref 13.0–17.0)
Hemoglobin: 10.1 g/dL — ABNORMAL LOW (ref 13.0–17.0)
Hemoglobin: 10.7 g/dL — ABNORMAL LOW (ref 13.0–17.0)
Hemoglobin: 8.2 g/dL — ABNORMAL LOW (ref 13.0–17.0)
Hemoglobin: 8.8 g/dL — ABNORMAL LOW (ref 13.0–17.0)
Hemoglobin: 8.8 g/dL — ABNORMAL LOW (ref 13.0–17.0)
Hemoglobin: 8.9 g/dL — ABNORMAL LOW (ref 13.0–17.0)
Hemoglobin: 9.1 g/dL — ABNORMAL LOW (ref 13.0–17.0)
Hemoglobin: 9.1 g/dL — ABNORMAL LOW (ref 13.0–17.0)
Hemoglobin: 9.8 g/dL — ABNORMAL LOW (ref 13.0–17.0)
Hemoglobin: 9.9 g/dL — ABNORMAL LOW (ref 13.0–17.0)
MCH: 30.7 pg (ref 26.0–34.0)
MCH: 30.8 pg (ref 26.0–34.0)
MCH: 31.1 pg (ref 26.0–34.0)
MCHC: 33.8 g/dL (ref 30.0–36.0)
MCHC: 34.1 g/dL (ref 30.0–36.0)
MCHC: 34.1 g/dL (ref 30.0–36.0)
MCHC: 34.2 g/dL (ref 30.0–36.0)
MCHC: 34.2 g/dL (ref 30.0–36.0)
MCHC: 34.4 g/dL (ref 30.0–36.0)
MCHC: 34.4 g/dL (ref 30.0–36.0)
MCHC: 34.4 g/dL (ref 30.0–36.0)
MCHC: 34.7 g/dL (ref 30.0–36.0)
MCHC: 35.6 g/dL (ref 30.0–36.0)
MCV: 90.1 fL (ref 78.0–100.0)
MCV: 90.3 fL (ref 78.0–100.0)
MCV: 90.8 fL (ref 78.0–100.0)
MCV: 90.9 fL (ref 78.0–100.0)
MCV: 90.9 fL (ref 78.0–100.0)
MCV: 91.1 fL (ref 78.0–100.0)
MCV: 91.6 fL (ref 78.0–100.0)
MCV: 91.6 fL (ref 78.0–100.0)
MCV: 91.7 fL (ref 78.0–100.0)
MCV: 91.9 fL (ref 78.0–100.0)
Platelets: 100 10*3/uL — ABNORMAL LOW (ref 150–400)
Platelets: 107 10*3/uL — ABNORMAL LOW (ref 150–400)
Platelets: 113 10*3/uL — ABNORMAL LOW (ref 150–400)
Platelets: 119 10*3/uL — ABNORMAL LOW (ref 150–400)
Platelets: 125 10*3/uL — ABNORMAL LOW (ref 150–400)
Platelets: 152 10*3/uL (ref 150–400)
Platelets: 162 10*3/uL (ref 150–400)
Platelets: 172 10*3/uL (ref 150–400)
Platelets: 186 10*3/uL (ref 150–400)
Platelets: 99 10*3/uL — ABNORMAL LOW (ref 150–400)
RBC: 2.68 MIL/uL — ABNORMAL LOW (ref 4.22–5.81)
RBC: 2.84 MIL/uL — ABNORMAL LOW (ref 4.22–5.81)
RBC: 2.85 MIL/uL — ABNORMAL LOW (ref 4.22–5.81)
RBC: 2.86 MIL/uL — ABNORMAL LOW (ref 4.22–5.81)
RBC: 2.87 MIL/uL — ABNORMAL LOW (ref 4.22–5.81)
RBC: 2.88 MIL/uL — ABNORMAL LOW (ref 4.22–5.81)
RBC: 3.1 MIL/uL — ABNORMAL LOW (ref 4.22–5.81)
RBC: 3.11 MIL/uL — ABNORMAL LOW (ref 4.22–5.81)
RBC: 3.13 MIL/uL — ABNORMAL LOW (ref 4.22–5.81)
RBC: 3.2 MIL/uL — ABNORMAL LOW (ref 4.22–5.81)
RBC: 3.23 MIL/uL — ABNORMAL LOW (ref 4.22–5.81)
RBC: 3.41 MIL/uL — ABNORMAL LOW (ref 4.22–5.81)
RDW: 13.8 % (ref 11.5–15.5)
RDW: 13.9 % (ref 11.5–15.5)
RDW: 13.9 % (ref 11.5–15.5)
RDW: 14 % (ref 11.5–15.5)
RDW: 14.3 % (ref 11.5–15.5)
RDW: 14.4 % (ref 11.5–15.5)
RDW: 14.5 % (ref 11.5–15.5)
RDW: 14.6 % (ref 11.5–15.5)
RDW: 14.8 % (ref 11.5–15.5)
RDW: 14.8 % (ref 11.5–15.5)
WBC: 10.5 10*3/uL (ref 4.0–10.5)
WBC: 11.8 10*3/uL — ABNORMAL HIGH (ref 4.0–10.5)
WBC: 12.6 10*3/uL — ABNORMAL HIGH (ref 4.0–10.5)
WBC: 12.8 10*3/uL — ABNORMAL HIGH (ref 4.0–10.5)
WBC: 13.5 10*3/uL — ABNORMAL HIGH (ref 4.0–10.5)
WBC: 4.7 10*3/uL (ref 4.0–10.5)
WBC: 5.7 10*3/uL (ref 4.0–10.5)
WBC: 5.7 10*3/uL (ref 4.0–10.5)
WBC: 6.8 10*3/uL (ref 4.0–10.5)
WBC: 7.7 10*3/uL (ref 4.0–10.5)
WBC: 8 10*3/uL (ref 4.0–10.5)
WBC: 9.8 10*3/uL (ref 4.0–10.5)

## 2010-08-31 LAB — BASIC METABOLIC PANEL
BUN: 22 mg/dL (ref 6–23)
BUN: 26 mg/dL — ABNORMAL HIGH (ref 6–23)
BUN: 29 mg/dL — ABNORMAL HIGH (ref 6–23)
BUN: 32 mg/dL — ABNORMAL HIGH (ref 6–23)
BUN: 36 mg/dL — ABNORMAL HIGH (ref 6–23)
BUN: 36 mg/dL — ABNORMAL HIGH (ref 6–23)
BUN: 39 mg/dL — ABNORMAL HIGH (ref 6–23)
BUN: 39 mg/dL — ABNORMAL HIGH (ref 6–23)
CO2: 23 mEq/L (ref 19–32)
CO2: 25 mEq/L (ref 19–32)
CO2: 25 mEq/L (ref 19–32)
CO2: 27 mEq/L (ref 19–32)
CO2: 28 mEq/L (ref 19–32)
CO2: 29 mEq/L (ref 19–32)
CO2: 29 mEq/L (ref 19–32)
CO2: 29 mEq/L (ref 19–32)
Calcium: 7.3 mg/dL — ABNORMAL LOW (ref 8.4–10.5)
Calcium: 7.8 mg/dL — ABNORMAL LOW (ref 8.4–10.5)
Calcium: 7.8 mg/dL — ABNORMAL LOW (ref 8.4–10.5)
Calcium: 8 mg/dL — ABNORMAL LOW (ref 8.4–10.5)
Calcium: 8.1 mg/dL — ABNORMAL LOW (ref 8.4–10.5)
Calcium: 8.2 mg/dL — ABNORMAL LOW (ref 8.4–10.5)
Calcium: 8.2 mg/dL — ABNORMAL LOW (ref 8.4–10.5)
Calcium: 8.3 mg/dL — ABNORMAL LOW (ref 8.4–10.5)
Calcium: 8.3 mg/dL — ABNORMAL LOW (ref 8.4–10.5)
Calcium: 8.5 mg/dL (ref 8.4–10.5)
Chloride: 103 mEq/L (ref 96–112)
Chloride: 106 mEq/L (ref 96–112)
Chloride: 107 mEq/L (ref 96–112)
Chloride: 95 mEq/L — ABNORMAL LOW (ref 96–112)
Chloride: 95 mEq/L — ABNORMAL LOW (ref 96–112)
Chloride: 96 mEq/L (ref 96–112)
Chloride: 97 mEq/L (ref 96–112)
Chloride: 98 mEq/L (ref 96–112)
Chloride: 99 mEq/L (ref 96–112)
Creatinine, Ser: 1.39 mg/dL (ref 0.4–1.5)
Creatinine, Ser: 1.46 mg/dL (ref 0.4–1.5)
Creatinine, Ser: 1.51 mg/dL — ABNORMAL HIGH (ref 0.4–1.5)
Creatinine, Ser: 1.51 mg/dL — ABNORMAL HIGH (ref 0.4–1.5)
Creatinine, Ser: 1.53 mg/dL — ABNORMAL HIGH (ref 0.4–1.5)
Creatinine, Ser: 1.58 mg/dL — ABNORMAL HIGH (ref 0.4–1.5)
Creatinine, Ser: 1.65 mg/dL — ABNORMAL HIGH (ref 0.4–1.5)
Creatinine, Ser: 1.66 mg/dL — ABNORMAL HIGH (ref 0.4–1.5)
Creatinine, Ser: 1.66 mg/dL — ABNORMAL HIGH (ref 0.4–1.5)
GFR calc Af Amer: 50 mL/min — ABNORMAL LOW (ref >60)
GFR calc Af Amer: 50 mL/min — ABNORMAL LOW (ref >60)
GFR calc Af Amer: 50 mL/min — ABNORMAL LOW (ref >60)
GFR calc Af Amer: 53 mL/min — ABNORMAL LOW (ref >60)
GFR calc Af Amer: 55 mL/min — ABNORMAL LOW (ref >60)
GFR calc Af Amer: 55 mL/min — ABNORMAL LOW (ref >60)
GFR calc Af Amer: 55 mL/min — ABNORMAL LOW (ref >60)
GFR calc Af Amer: 58 mL/min — ABNORMAL LOW (ref >60)
GFR calc Af Amer: 58 mL/min — ABNORMAL LOW (ref >60)
GFR calc Af Amer: >60 mL/min (ref >60)
GFR calc non Af Amer: 41 mL/min — ABNORMAL LOW (ref >60)
GFR calc non Af Amer: 41 mL/min — ABNORMAL LOW (ref >60)
GFR calc non Af Amer: 41 mL/min — ABNORMAL LOW (ref >60)
GFR calc non Af Amer: 43 mL/min — ABNORMAL LOW (ref >60)
GFR calc non Af Amer: 45 mL/min — ABNORMAL LOW (ref >60)
GFR calc non Af Amer: 46 mL/min — ABNORMAL LOW (ref >60)
GFR calc non Af Amer: 46 mL/min — ABNORMAL LOW (ref >60)
GFR calc non Af Amer: 48 mL/min — ABNORMAL LOW (ref >60)
GFR calc non Af Amer: 50 mL/min — ABNORMAL LOW (ref >60)
Glucose, Bld: 105 mg/dL — ABNORMAL HIGH (ref 70–99)
Glucose, Bld: 120 mg/dL — ABNORMAL HIGH (ref 70–99)
Glucose, Bld: 135 mg/dL — ABNORMAL HIGH (ref 70–99)
Glucose, Bld: 148 mg/dL — ABNORMAL HIGH (ref 70–99)
Glucose, Bld: 150 mg/dL — ABNORMAL HIGH (ref 70–99)
Glucose, Bld: 164 mg/dL — ABNORMAL HIGH (ref 70–99)
Glucose, Bld: 178 mg/dL — ABNORMAL HIGH (ref 70–99)
Glucose, Bld: 199 mg/dL — ABNORMAL HIGH (ref 70–99)
Potassium: 4 mEq/L (ref 3.5–5.1)
Potassium: 4.1 mEq/L (ref 3.5–5.1)
Potassium: 4.3 mEq/L (ref 3.5–5.1)
Potassium: 4.3 mEq/L (ref 3.5–5.1)
Potassium: 4.4 mEq/L (ref 3.5–5.1)
Potassium: 4.4 mEq/L (ref 3.5–5.1)
Potassium: 4.4 mEq/L (ref 3.5–5.1)
Potassium: 4.9 mEq/L (ref 3.5–5.1)
Sodium: 131 mEq/L — ABNORMAL LOW (ref 135–145)
Sodium: 131 mEq/L — ABNORMAL LOW (ref 135–145)
Sodium: 132 mEq/L — ABNORMAL LOW (ref 135–145)
Sodium: 134 mEq/L — ABNORMAL LOW (ref 135–145)
Sodium: 134 mEq/L — ABNORMAL LOW (ref 135–145)
Sodium: 135 mEq/L (ref 135–145)
Sodium: 135 mEq/L (ref 135–145)
Sodium: 136 mEq/L (ref 135–145)
Sodium: 136 mEq/L (ref 135–145)
Sodium: 137 mEq/L (ref 135–145)

## 2010-08-31 LAB — POCT I-STAT, CHEM 8
BUN: 21 mg/dL (ref 6–23)
BUN: 25 mg/dL — ABNORMAL HIGH (ref 6–23)
Calcium, Ion: 1.02 mmol/L — ABNORMAL LOW (ref 1.12–1.32)
Calcium, Ion: 1.07 mmol/L — ABNORMAL LOW (ref 1.12–1.32)
Chloride: 105 mEq/L (ref 96–112)
Chloride: 105 mEq/L (ref 96–112)
Creatinine, Ser: 1.1 mg/dL (ref 0.4–1.5)
Creatinine, Ser: 1.6 mg/dL — ABNORMAL HIGH (ref 0.4–1.5)
Glucose, Bld: 182 mg/dL — ABNORMAL HIGH (ref 70–99)
Glucose, Bld: 219 mg/dL — ABNORMAL HIGH (ref 70–99)
HCT: 27 % — ABNORMAL LOW (ref 39.0–52.0)
HCT: 30 % — ABNORMAL LOW (ref 39.0–52.0)
Hemoglobin: 10.2 g/dL — ABNORMAL LOW (ref 13.0–17.0)
Hemoglobin: 9.2 g/dL — ABNORMAL LOW (ref 13.0–17.0)
Potassium: 4.4 mEq/L (ref 3.5–5.1)
Potassium: 4.5 mEq/L (ref 3.5–5.1)
Sodium: 137 mEq/L (ref 135–145)
Sodium: 137 mEq/L (ref 135–145)
TCO2: 19 mmol/L (ref 0–100)
TCO2: 20 mmol/L (ref 0–100)

## 2010-08-31 LAB — GLUCOSE, CAPILLARY
Glucose-Capillary: 101 mg/dL — ABNORMAL HIGH (ref 70–99)
Glucose-Capillary: 101 mg/dL — ABNORMAL HIGH (ref 70–99)
Glucose-Capillary: 102 mg/dL — ABNORMAL HIGH (ref 70–99)
Glucose-Capillary: 118 mg/dL — ABNORMAL HIGH (ref 70–99)
Glucose-Capillary: 122 mg/dL — ABNORMAL HIGH (ref 70–99)
Glucose-Capillary: 126 mg/dL — ABNORMAL HIGH (ref 70–99)
Glucose-Capillary: 129 mg/dL — ABNORMAL HIGH (ref 70–99)
Glucose-Capillary: 130 mg/dL — ABNORMAL HIGH (ref 70–99)
Glucose-Capillary: 133 mg/dL — ABNORMAL HIGH (ref 70–99)
Glucose-Capillary: 134 mg/dL — ABNORMAL HIGH (ref 70–99)
Glucose-Capillary: 136 mg/dL — ABNORMAL HIGH (ref 70–99)
Glucose-Capillary: 137 mg/dL — ABNORMAL HIGH (ref 70–99)
Glucose-Capillary: 140 mg/dL — ABNORMAL HIGH (ref 70–99)
Glucose-Capillary: 141 mg/dL — ABNORMAL HIGH (ref 70–99)
Glucose-Capillary: 142 mg/dL — ABNORMAL HIGH (ref 70–99)
Glucose-Capillary: 144 mg/dL — ABNORMAL HIGH (ref 70–99)
Glucose-Capillary: 145 mg/dL — ABNORMAL HIGH (ref 70–99)
Glucose-Capillary: 147 mg/dL — ABNORMAL HIGH (ref 70–99)
Glucose-Capillary: 147 mg/dL — ABNORMAL HIGH (ref 70–99)
Glucose-Capillary: 149 mg/dL — ABNORMAL HIGH (ref 70–99)
Glucose-Capillary: 149 mg/dL — ABNORMAL HIGH (ref 70–99)
Glucose-Capillary: 151 mg/dL — ABNORMAL HIGH (ref 70–99)
Glucose-Capillary: 155 mg/dL — ABNORMAL HIGH (ref 70–99)
Glucose-Capillary: 156 mg/dL — ABNORMAL HIGH (ref 70–99)
Glucose-Capillary: 157 mg/dL — ABNORMAL HIGH (ref 70–99)
Glucose-Capillary: 158 mg/dL — ABNORMAL HIGH (ref 70–99)
Glucose-Capillary: 159 mg/dL — ABNORMAL HIGH (ref 70–99)
Glucose-Capillary: 161 mg/dL — ABNORMAL HIGH (ref 70–99)
Glucose-Capillary: 161 mg/dL — ABNORMAL HIGH (ref 70–99)
Glucose-Capillary: 162 mg/dL — ABNORMAL HIGH (ref 70–99)
Glucose-Capillary: 168 mg/dL — ABNORMAL HIGH (ref 70–99)
Glucose-Capillary: 178 mg/dL — ABNORMAL HIGH (ref 70–99)
Glucose-Capillary: 181 mg/dL — ABNORMAL HIGH (ref 70–99)
Glucose-Capillary: 186 mg/dL — ABNORMAL HIGH (ref 70–99)
Glucose-Capillary: 189 mg/dL — ABNORMAL HIGH (ref 70–99)
Glucose-Capillary: 194 mg/dL — ABNORMAL HIGH (ref 70–99)
Glucose-Capillary: 195 mg/dL — ABNORMAL HIGH (ref 70–99)
Glucose-Capillary: 195 mg/dL — ABNORMAL HIGH (ref 70–99)
Glucose-Capillary: 200 mg/dL — ABNORMAL HIGH (ref 70–99)
Glucose-Capillary: 203 mg/dL — ABNORMAL HIGH (ref 70–99)
Glucose-Capillary: 211 mg/dL — ABNORMAL HIGH (ref 70–99)
Glucose-Capillary: 213 mg/dL — ABNORMAL HIGH (ref 70–99)
Glucose-Capillary: 220 mg/dL — ABNORMAL HIGH (ref 70–99)
Glucose-Capillary: 220 mg/dL — ABNORMAL HIGH (ref 70–99)
Glucose-Capillary: 235 mg/dL — ABNORMAL HIGH (ref 70–99)
Glucose-Capillary: 87 mg/dL (ref 70–99)
Glucose-Capillary: 91 mg/dL (ref 70–99)
Glucose-Capillary: 94 mg/dL (ref 70–99)

## 2010-08-31 LAB — EXPECTORATED SPUTUM ASSESSMENT W GRAM STAIN, RFLX TO RESP C

## 2010-08-31 LAB — URINE CULTURE
Colony Count: NO GROWTH
Colony Count: NO GROWTH
Culture: NO GROWTH
Culture: NO GROWTH
Special Requests: NEGATIVE

## 2010-08-31 LAB — URINALYSIS, ROUTINE W REFLEX MICROSCOPIC
Bilirubin Urine: NEGATIVE
Bilirubin Urine: NEGATIVE
Glucose, UA: NEGATIVE mg/dL
Glucose, UA: NEGATIVE mg/dL
Ketones, ur: NEGATIVE mg/dL
Ketones, ur: NEGATIVE mg/dL
Nitrite: NEGATIVE
Nitrite: NEGATIVE
Protein, ur: NEGATIVE mg/dL
Protein, ur: NEGATIVE mg/dL
Specific Gravity, Urine: 1.021 (ref 1.005–1.030)
Specific Gravity, Urine: 1.023 (ref 1.005–1.030)
Urobilinogen, UA: 0.2 mg/dL (ref 0.0–1.0)
Urobilinogen, UA: 1 mg/dL (ref 0.0–1.0)
pH: 5 (ref 5.0–8.0)
pH: 5 (ref 5.0–8.0)

## 2010-08-31 LAB — POCT I-STAT 4, (NA,K, GLUC, HGB,HCT)
Glucose, Bld: 119 mg/dL — ABNORMAL HIGH (ref 70–99)
Glucose, Bld: 138 mg/dL — ABNORMAL HIGH (ref 70–99)
Glucose, Bld: 150 mg/dL — ABNORMAL HIGH (ref 70–99)
Glucose, Bld: 152 mg/dL — ABNORMAL HIGH (ref 70–99)
Glucose, Bld: 162 mg/dL — ABNORMAL HIGH (ref 70–99)
Glucose, Bld: 168 mg/dL — ABNORMAL HIGH (ref 70–99)
Glucose, Bld: 186 mg/dL — ABNORMAL HIGH (ref 70–99)
HCT: 20 % — ABNORMAL LOW (ref 39.0–52.0)
HCT: 26 % — ABNORMAL LOW (ref 39.0–52.0)
HCT: 27 % — ABNORMAL LOW (ref 39.0–52.0)
HCT: 28 % — ABNORMAL LOW (ref 39.0–52.0)
HCT: 29 % — ABNORMAL LOW (ref 39.0–52.0)
HCT: 31 % — ABNORMAL LOW (ref 39.0–52.0)
HCT: 31 % — ABNORMAL LOW (ref 39.0–52.0)
Hemoglobin: 10.5 g/dL — ABNORMAL LOW (ref 13.0–17.0)
Hemoglobin: 10.5 g/dL — ABNORMAL LOW (ref 13.0–17.0)
Hemoglobin: 6.8 g/dL — CL (ref 13.0–17.0)
Hemoglobin: 8.8 g/dL — ABNORMAL LOW (ref 13.0–17.0)
Hemoglobin: 9.2 g/dL — ABNORMAL LOW (ref 13.0–17.0)
Hemoglobin: 9.5 g/dL — ABNORMAL LOW (ref 13.0–17.0)
Hemoglobin: 9.9 g/dL — ABNORMAL LOW (ref 13.0–17.0)
Potassium: 4 mEq/L (ref 3.5–5.1)
Potassium: 4.1 mEq/L (ref 3.5–5.1)
Potassium: 4.2 mEq/L (ref 3.5–5.1)
Potassium: 4.5 mEq/L (ref 3.5–5.1)
Potassium: 4.5 mEq/L (ref 3.5–5.1)
Potassium: 4.6 mEq/L (ref 3.5–5.1)
Potassium: 4.7 mEq/L (ref 3.5–5.1)
Sodium: 132 mEq/L — ABNORMAL LOW (ref 135–145)
Sodium: 132 mEq/L — ABNORMAL LOW (ref 135–145)
Sodium: 133 mEq/L — ABNORMAL LOW (ref 135–145)
Sodium: 134 mEq/L — ABNORMAL LOW (ref 135–145)
Sodium: 136 mEq/L (ref 135–145)
Sodium: 136 mEq/L (ref 135–145)
Sodium: 136 mEq/L (ref 135–145)

## 2010-08-31 LAB — TYPE AND SCREEN
ABO/RH(D): O NEG
Antibody Screen: NEGATIVE

## 2010-08-31 LAB — HEMOGLOBIN AND HEMATOCRIT, BLOOD
HCT: 26 % — ABNORMAL LOW (ref 39.0–52.0)
Hemoglobin: 9 g/dL — ABNORMAL LOW (ref 13.0–17.0)

## 2010-08-31 LAB — POCT I-STAT GLUCOSE
Glucose, Bld: 135 mg/dL — ABNORMAL HIGH (ref 70–99)
Glucose, Bld: 231 mg/dL — ABNORMAL HIGH (ref 70–99)
Operator id: 177011
Operator id: 3390

## 2010-08-31 LAB — PREPARE PLATELETS

## 2010-08-31 LAB — CREATININE, SERUM
Creatinine, Ser: 1.32 mg/dL (ref 0.4–1.5)
Creatinine, Ser: 1.77 mg/dL — ABNORMAL HIGH (ref 0.4–1.5)
GFR calc Af Amer: 46 mL/min — ABNORMAL LOW (ref >60)
GFR calc Af Amer: >60 mL/min (ref >60)
GFR calc non Af Amer: 38 mL/min — ABNORMAL LOW (ref >60)
GFR calc non Af Amer: 53 mL/min — ABNORMAL LOW (ref >60)

## 2010-08-31 LAB — CULTURE, RESPIRATORY W GRAM STAIN: Culture: NORMAL

## 2010-08-31 LAB — PREPARE FRESH FROZEN PLASMA

## 2010-08-31 LAB — CULTURE, BLOOD (ROUTINE X 2)
Culture: NO GROWTH
Culture: NO GROWTH
Culture: NO GROWTH
Culture: NO GROWTH

## 2010-08-31 LAB — URINE MICROSCOPIC-ADD ON

## 2010-08-31 LAB — CORTISOL: Cortisol, Plasma: 24.1 ug/dL

## 2010-08-31 LAB — APTT: aPTT: 34 seconds (ref 24–37)

## 2010-08-31 LAB — MAGNESIUM
Magnesium: 2.5 mg/dL (ref 1.5–2.5)
Magnesium: 2.6 mg/dL — ABNORMAL HIGH (ref 1.5–2.5)
Magnesium: 2.8 mg/dL — ABNORMAL HIGH (ref 1.5–2.5)

## 2010-08-31 LAB — PROTIME-INR
INR: 1.31 (ref 0.00–1.49)
Prothrombin Time: 16.2 seconds — ABNORMAL HIGH (ref 11.6–15.2)

## 2010-08-31 LAB — PLATELET COUNT: Platelets: 101 10*3/uL — ABNORMAL LOW (ref 150–400)

## 2010-08-31 LAB — CLOSTRIDIUM DIFFICILE EIA: C difficile Toxins A+B, EIA: NEGATIVE

## 2010-09-01 LAB — URINALYSIS, ROUTINE W REFLEX MICROSCOPIC
Bilirubin Urine: NEGATIVE
Glucose, UA: 100 mg/dL — AB
Ketones, ur: NEGATIVE mg/dL
Nitrite: NEGATIVE
Protein, ur: NEGATIVE mg/dL
Specific Gravity, Urine: 1.02 (ref 1.005–1.030)
Urobilinogen, UA: 0.2 mg/dL (ref 0.0–1.0)
pH: 5 (ref 5.0–8.0)

## 2010-09-01 LAB — BLOOD GAS, ARTERIAL
Acid-base deficit: 2.5 mmol/L — ABNORMAL HIGH (ref 0.0–2.0)
Bicarbonate: 21.2 mEq/L (ref 20.0–24.0)
Drawn by: 206361
FIO2: 0.21 %
O2 Saturation: 98.8 %
Patient temperature: 98.6
TCO2: 22.2 mmol/L (ref 0–100)
pCO2 arterial: 33.1 mmHg — ABNORMAL LOW (ref 35.0–45.0)
pH, Arterial: 7.422 (ref 7.350–7.450)
pO2, Arterial: 91.9 mmHg (ref 80.0–100.0)

## 2010-09-01 LAB — COMPREHENSIVE METABOLIC PANEL
ALT: 22 U/L (ref 0–53)
AST: 21 U/L (ref 0–37)
Albumin: 3.7 g/dL (ref 3.5–5.2)
Alkaline Phosphatase: 72 U/L (ref 39–117)
BUN: 32 mg/dL — ABNORMAL HIGH (ref 6–23)
CO2: 20 mEq/L (ref 19–32)
Calcium: 9.6 mg/dL (ref 8.4–10.5)
Chloride: 107 mEq/L (ref 96–112)
Creatinine, Ser: 1.42 mg/dL (ref 0.4–1.5)
GFR calc Af Amer: 59 mL/min — ABNORMAL LOW (ref >60)
GFR calc non Af Amer: 49 mL/min — ABNORMAL LOW (ref >60)
Glucose, Bld: 148 mg/dL — ABNORMAL HIGH (ref 70–99)
Potassium: 5.1 mEq/L (ref 3.5–5.1)
Sodium: 136 mEq/L (ref 135–145)
Total Bilirubin: 0.5 mg/dL (ref 0.3–1.2)
Total Protein: 6.3 g/dL (ref 6.0–8.3)

## 2010-09-01 LAB — URINE MICROSCOPIC-ADD ON

## 2010-09-01 LAB — CBC
HCT: 35.2 % — ABNORMAL LOW (ref 39.0–52.0)
Hemoglobin: 12.5 g/dL — ABNORMAL LOW (ref 13.0–17.0)
MCHC: 35.5 g/dL (ref 30.0–36.0)
MCV: 88.6 fL (ref 78.0–100.0)
Platelets: 212 10*3/uL (ref 150–400)
RBC: 3.97 MIL/uL — ABNORMAL LOW (ref 4.22–5.81)
RDW: 13.6 % (ref 11.5–15.5)
WBC: 8.3 10*3/uL (ref 4.0–10.5)

## 2010-09-01 LAB — SURGICAL PCR SCREEN
MRSA, PCR: NEGATIVE
Staphylococcus aureus: NEGATIVE

## 2010-09-01 LAB — ABO/RH: ABO/RH(D): O NEG

## 2010-09-01 LAB — APTT: aPTT: 28 seconds (ref 24–37)

## 2010-09-01 LAB — TYPE AND SCREEN
ABO/RH(D): O NEG
Antibody Screen: NEGATIVE

## 2010-09-01 LAB — HEMOGLOBIN A1C
Hgb A1c MFr Bld: 8 % — ABNORMAL HIGH (ref <5.7)
Mean Plasma Glucose: 183 mg/dL — ABNORMAL HIGH (ref <117)

## 2010-09-01 LAB — PROTIME-INR
INR: 1.01 (ref 0.00–1.49)
Prothrombin Time: 13.2 seconds (ref 11.6–15.2)

## 2010-09-02 ENCOUNTER — Ambulatory Visit (INDEPENDENT_AMBULATORY_CARE_PROVIDER_SITE_OTHER): Payer: PRIVATE HEALTH INSURANCE | Admitting: Vascular Surgery

## 2010-09-02 ENCOUNTER — Other Ambulatory Visit: Payer: PRIVATE HEALTH INSURANCE

## 2010-09-02 DIAGNOSIS — I6529 Occlusion and stenosis of unspecified carotid artery: Secondary | ICD-10-CM

## 2010-09-03 ENCOUNTER — Telehealth: Payer: Self-pay | Admitting: Emergency Medicine

## 2010-09-03 ENCOUNTER — Telehealth: Payer: Self-pay

## 2010-09-03 NOTE — Assessment & Plan Note (Signed)
OFFICE VISIT  [name] [name], [name] [name] DOB:  18-Sep-1935                                       09/02/2010 BJYNW#:[id number]  The patient presents today for continued follow-up of his carotid endarterectomy on the left.  He had a combined carotid endarterectomy and coronary bypass grafting with Dr. Donata Clay at the same setting on 11/28/2009.  He has done well from a cardiac and vascular standpoint. He says he was recently admitted for pneumonia and is resolving this. He has no neurologic deficits.  PAST MEDICAL HISTORY:  Otherwise unchanged.  PHYSICAL EXAMINATION:  Well-developed, well-nourished white male appearing stated age in no acute stress.  Blood pressure 118/70, pulse 56, respirations 18,  oxygen saturations are 99% in room air.  HEENT: Normal.  His carotid arteries are without bruits. He has a well-healed left carotid incision.  Radial pulses are 2+ bilaterally.  Neurologic: No focal weakness or paresthesias.  Skin:  Without ulcers or rashes. Musculoskeletal:  No major deformity or cyanosis.  He did undergo duplex at [facility] 06/12/2010.  I have reviewed this with him.  This shows widely patent endarterectomy and moderate stenosis in the right carotid system.  I have recommended continued yearly follow-up.  He reports that to his knowledge this has not been scheduled elsewhere.  Therefore we will schedule him for a carotid duplex in 1 year.  He will call us and cancel this should he have a scan elsewhere.    Larina Earthly, M.D. Electronically Signed  TFE/MEDQ  D:  09/02/2010  T:  09/03/2010  Job:  [id number]  cc:   [place] Reid, MD Antonieta Iba, MD

## 2010-09-03 NOTE — Telephone Encounter (Signed)
We should check his labs every 6 months. Last check in 04/2010 looked good. Would set up for lfts and TSH in May 2012

## 2010-09-03 NOTE — Telephone Encounter (Signed)
Called daughter, Efraim Kaufmann, to let her know that there is no association between Amiodarone and sarcoids. Per Dr.Gollan, "with that being said, he is far enough out from his surgery 6/11 that we can probably stop amiodarone". Melissa notified, pt can stop amiodarone. She verbalized understanding.

## 2010-09-03 NOTE — Telephone Encounter (Signed)
Patient was in the hospital the first of the month with lung disease and he was told when taking the metoprolol and amiodarone may cause liver damage also.  Does he need to be concerned about taking the medicaitons?   Call back number is [phone].

## 2010-09-04 ENCOUNTER — Telehealth: Payer: Self-pay

## 2010-09-04 NOTE — Telephone Encounter (Signed)
Notified patient need to come for blood work on Nov 04, 2010 for liver & BMP.

## 2010-09-11 NOTE — Progress Notes (Signed)
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  If this is true, can the pt be taken off of it? Initial call taken by: Harlon Flor,  August 27, 2010 2:54 PM  Follow-up for Phone Call        No significant association between [facility] and sarcoid. With that said, he is far enough out from his surgery 6/11 that we can probably stop Amio     Appended Document: Sarcosis LMOM TCB/sab

## 2010-10-28 NOTE — Consult Note (Signed)
NEW PATIENT CONSULTATION   [name] [name], [name] [name]  DOB:  02-26-1936                                        November 25, 2009  CHART #:  [id number]   REASON FOR CONSULTATION:  Severe three-vessel coronary artery disease,  non-ST-elevation MI.   HISTORY OF PRESENT ILLNESS:  I was asked to evaluate this 75 year old  Caucasian male, diabetic ex-smoker for possible multivessel bypass  grafting after being recently diagnosed by a cardiac cath with severe  three-vessel disease.  The patient was hospitalized at [facility] June 3 and June 4 with E. coli sepsis and fever to 104 after  undergoing lithotripsy of a bladder stone 3 days earlier at the [facility] and discharged with a Foley catheter in place.  He presented  with fever, delirium, shortness of breath and a white count of 9000.  His EKG showed changes and his cardiac enzymes were checked and his  troponin peaked at 2.9 ng/mL.  He was placed on heparin and a beta-  blocker and treated with IV antibiotics.  The E. coli was pansensitive  except for resistant to Cipro.  He was started on IV cephalosporin and  then converted oral Keflex for a 12-day treatment course.  While he was  in the hospital, a 2-D echo was performed which showed EF of 45% with  some anterior wall hypokinesia, LVH, and no significant valve disease.  He is referred to Dr. Julien Nordmann for evaluation of his positive  cardiac enzymes.  Dr. Mariah Milling recommended cardiac catheterization, which  was performed at [facility].  This showed severe three-  vessel disease and a diabetic pattern with EF of 45% and LVEDP of 26  mmHg.  The LAD had a proximal 80-90% tubular stenosis.  There was 80-90%  stenosis of the takeoff of the diagonal 1 and diagonal 2.  The  circumflex marginal was nondominant and small diffuse disease.  The  right coronary was dominant with an 80% stenosis distally at the takeoff  of the posterior  descending.  The LAD diagonal branches and posterior  descending were adequate targets for grafting.  EF was 45% and the  patient was felt to be a candidate for surgical revascularization.  Of  note, on June 4, the patient was discharged from the hospital by  hospitalist.  She was given 600 mg of Plavix, but that was not continued  as an outpatient.  The patient's Foley catheter is out, he is afebrile,  he has no dysuria, and he is walking and eating normally.  However, he  does note that over the past several weeks, he has had decreasing  exercise tolerance and dyspnea with exertion and easily fatigability.  No specific chest pain.  No resting symptoms of orthopnea, PND.  No  ankle edema.  He presents now for evaluation of surgical  revascularization.  Earlier in the week, he has been evaluated by Dr.  Tawanna Cooler Early.  He has a Carotid duplex ultrasound performed, when he was  in [facility] demonstrated 80%-90% stenosis of the left carotid  artery plaque.  This was asymptomatic, although he does have a left  carotid bruit on exam.  It was felt that the patient would benefit from  a combined multivessel bypass surgery with left carotid endarterectomy.   PAST MEDICAL HISTORY:  1. Type  2 diabetes mellitus.  2. Hypertension.  3. Status post partial right nephrectomy in year 2000 for renal cell      cancer without recurrence.  4. Gallstones.  5. No known drug allergies.  6. Baseline creatinine 1.6.  7. Status post bilateral knee replacement.   HOME MEDICATIONS:  Glipizide 10 mg daily, Prilosec 20 mg daily,  lisinopril 10 mg daily, Atarax 10 mg for p.r.n. rash, Actos 45 mg daily,  simvastatin 40 mg at bedtime, ropinirole 0.25 mg p.o. at bedtime for  restless leg, terazosin 2 mg p.o. at bedtime for BPH, aspirin 325 mg a  day, metoprolol tartrate 50 mg p.o. b.i.d., and Hytrin 2 mg p.o. at  bedtime.  He is completing a course of Keflex 500 mg p.o. t.i.d. for 10  days.   SOCIAL HISTORY:   He is retired and used to work in Production designer, theatre/television/film.  He is  married with adult daughters.  He stopped smoking in 1990.  He uses  alcohol minimally.  He likes to walk and stay active in the yard but he  has had difficulty with shortness of breath lately.   FAMILY HISTORY:  Positive for diabetes, hypertension and coronary artery  disease.   SURGICAL HISTORY:  Positive for partial right nephrectomy for his renal  cell cancer, bilateral cataract surgery with lens implants and bilateral  knee replacement.   REVIEW OF SYSTEMS:  CONSTITUTIONAL:  Significant for his recent  hospitalization for urosepsis with E. coli sensitive to cephalosporin  resistant to Cipro.  He has baseline BPH and long history of kidney  stones as well as a history of prior renal cell cancer, which was  resected 10 years ago.  His diabetes has been fairly well controlled  with oral agents and he denies any retinopathy significant neuropathy or  skin ulcers.  His gallstones are asymptomatic and he has had no evidence  of jaundice.  He denies stroke, TIA, despite the left carotid stenosis  and carotid bruit.  He has had no thoracic injuries, history of abnormal  chest x-ray, pulmonary nodule, productive cough or hemoptysis.  He  denies bleeding problems with his multiple operations or difficulty with  anesthesia.  He has a total upper plate and partial lower plate.  He  denies difficulty swallowing, weight loss, and no fever since he has  been discharge from the hospital on antibiotics.   PHYSICAL EXAMINATION:  Vital Signs:  The patient is 5 feet 7, weight is  195 pounds.  Blood pressure 118/70, pulse 80, respirations 16,  saturation 97%.  He is afebrile.  General:  He is a pleasant middle-aged  Caucasian male, in no distress accompanied by his wife and daughters.  HEENT:  Normocephalic.  Pupils have been surgically altered with  cataract surgery.  Dentition is good with an upper plate.  Neck:  Without JV or mass.  He has a  left carotid bruit.  There is no  adenopathy palpable in the neck or supraclavicular fossa.  There is no  thoracic deformity and breath sounds are clear and equal.  Cardiac:  Regular rhythm without S3 gallop, murmur or rub.  Abdominal soft without  pulsatile mass.  Extremities:  No clubbing, cyanosis, or edema.  Peripheral pulses are 2+ in the upper and lower extremities.  There is  no significant venous insufficiency, although there is some superficial  spider veins in the legs.  Neurologic:  Alert and oriented.  He has a  normal gait without focal motor deficit.  No  skin ulcers were seen.   LABORATORY DATA:  The cardiac cath arteriograms are reviewed and he has  severe three-vessel disease with mild-to-moderate reduction of LV  function.  He has severe left carotid stenosis.  He has baseline  creatinine elevated at 1.6 from previous partial nephrectomy.   PLAN:  The patient will be prepared for combined multivessel CABG with  left carotid endarterectomy on June 16.  The patient is discussed the  aspects of carotid surgery with Dr. Gretta Began, and I have reviewed the  indications, benefits, alternatives and risks of multivessel bypass  grafting for treatment of severe coronary disease.  He will finish his  course of oral antibiotics and return for a preop echo and lab workup  tomorrow.   Kerin Perna, M.D.  Electronically Signed   PV/MEDQ  D:  11/25/2009  T:  11/26/2009  Job:  [id number]   cc:   Antonieta Iba, MD  [place] Reid, MD

## 2010-10-28 NOTE — Assessment & Plan Note (Signed)
OFFICE VISIT   [name] [name], [name] [name]  DOB:  08/07/1935                                        February 19, 2010  CHART #:  [id number]   CURRENT PROBLEMS:  1. Coronary artery bypass grafting x5, November 28, 2009, for severe      multivessel disease with subendocardial myocardial infarction.  2. Diabetes mellitus.  3. Hypertension.  4. Postoperative atrial fibrillation, converted to sinus rhythm on      amiodarone.   PRESENT ILLNESS:  The patient is a very nice 75 year old gentleman from  [facility] returns for his final office visit after undergoing  multivessel bypass grafting earlier this summer for severe three-vessel  disease.  He underwent left IMA grafting to his LAD and vein grafts to  the diagonal 1 and 2, posterior descending and posterolateral branch of  the right coronary.  He has done well at home and is walking without  recurrent angina.  The surgical incisions are healing well and he has no  symptoms of CHF.  He has maintained a sinus rhythm and he is taking only  1 amiodarone a day.  He is being followed carefully by Dr. Julien Nordmann and also maintains aspirin 1 a day, Crestor, and metoprolol 50 mg  b.i.d.   PHYSICAL EXAMINATION:  Vital Signs:  Blood pressure 100/60, pulse 72,  and respirations 18.  General:  He is alert and pleasant.  Lungs:  Breath sounds are clear and equal.  The sternum is well healed.  Cardiac:  Rhythm is regular without S3 gallop or murmur.  Extremities:  The leg incision is well healed.  There is no peripheral edema.   IMPRESSION AND PLAN:  The patient has done well, now 2 months after  surgery.  He is ready to resume driving and almost full activity.  He  knows not to lift more than 20 pounds, however, until October.  He is  told he could drive and do most daily activities except for heavy  lifting.  We will try to arrange him to enter a phase II cardiac rehab  program at [facility] to optimize  his recovery.  He did not request any new  medications or prescriptions.  He will return here as needed.   Kerin Perna, M.D.  Electronically Signed   PV/MEDQ  D:  02/19/2010  T:  02/20/2010  Job:  [id number]

## 2010-10-28 NOTE — Assessment & Plan Note (Signed)
OFFICE VISIT   ILLIAS, [name]  DOB:  19-Jul-1935                                        December 25, 2009  CHART #:  [id number]   CURRENT PROBLEMS:  1. Status post CABG x5 on June 16, for severe three-vessel coronary      artery disease with recent subendocardial MI.  2. History of bladder outlet obstruction and urosepsis prior to      surgery.  3. Diabetes mellitus type 2.  4. Hypertension.  5. Severe left carotid stenosis status post left carotid      endarterectomy by Dr. Arbie Cookey with combined CABG in June 2011.  6. Postoperative atrial fibrillation converted into sinus rhythm, on      amiodarone.   CURRENT MEDICATIONS:  Lisinopril 10 mg a day (on hold), aspirin 325 mg a  day, metoprolol 25 mg b.i.d., glipizide 20 mg p.o. b.i.d., Prilosec 20  mg a day, Actos 30 mg a day, Crestor 1 tablet daily, amiodarone 200 mg  b.i.d., Robitussin p.r.n. cough, Lasix 40 mg p.r.n., hydrocodone p.r.n.  pain.   PRESENT ILLNESS:  The patient is a 75 year old Caucasian male returns  for his first office visit after multivessel bypass grafting in June for  severe three-vessel disease.  He had a non-Q-wave MI when he was  hospitalized at [facility] with urosepsis and had a bump in his  cardiac enzymes and subsequent cardiac catheterization.  At the time of  operation, he had left IMA grafting to his LAD and vein grafts to the  diagonal 1, diagonal 2, posterior descending, and posterolateral branch  of the right coronary.  He did well postop.  He did develop atrial  fibrillation transiently, which converted to sinus rhythm on amiodarone.  He did not require Coumadin therapy.  He had no UTI problems postop.  Since returning home, he has had no recurrent angina, no symptoms of  CHF, and the surgical incisions are healing well.   We talked about his sleep apnea and he is unable to tolerate the BiPAP  mask at this time.  He is also having severe family stress issues from  his second wife and his stepchildren and is staying with his daughter  currently in [place] for early recovery.  He has also had some issues  including constipation, shoulder pain, and chest wall tenderness and all  these were discussed.  He is walking daily about half a mile and is  improving his overall strength.   PHYSICAL EXAMINATION:  Blood pressure 105/60, pulse 75 and regular,  saturation on room air 95%.  He is alert and pleasant.  Breath sounds  are clear and equal.  The sternum is stable and healing well.  CARDIAC:  Rhythm is regular.  He has no significant pedal edema.   LABORATORY DATA:  PA and lateral chest x-ray reveals clear lung fields,  no significant pleural effusion.  The sternal wires are intact.   IMPRESSION AND PLAN:  The patient was encouraged to make plans to enter  a supervised cardiac rehab program, which is hospital based either in  [place] or at [place].  We will continue his current medications and I  provided him with the prescription for his Xanax for some extreme  stress.  This will be a very short-term 0.25 mg p.o. b.i.d. course.  He  will return  for 4 weeks for further review and to make sure that he is  doing a proper rehabilitation.   [name] [name], M.D.  Electronically Signed   PV/MEDQ  D:  12/25/2009  T:  12/26/2009  Job:  [id number]   cc:   [name] Iba, MD

## 2010-10-28 NOTE — Consult Note (Signed)
NEW PATIENT CONSULTATION   [name] [name]  DOB:  [birth year]                                       11/22/2009  [id number]#:[id number]   The patient presents today for evaluation of a severe left internal  carotid artery stenosis.  He is a very pleasant 75 year old gentleman  who was admitted at [facility] after feeling quite faint and  having a temperature of 105 according to his daughter.  He was  diaphoretic and was admitted to the hospital with what sounds like a  urinary tract infection and sepsis and had also cardiac ischemia.  He  underwent a cardiac workup which included a positive stress test and  therefore underwent cardiac catheterization showing severe three-vessel  cardiac disease.  He subsequently underwent carotid duplex evaluation  for screening to assure no problems there prior to potential heart  surgery and this shows severe stenosis in his left internal carotid  artery.  He denies any prior carotid difficulties specifically any  stroke, amaurosis fugax, transient ischemic attack.  He had no known  previous heart disease up until his most recent event last week at  [facility].  History is significant for diabetes, hypertension, elevated  cholesterol.  He is not on insulin.  He has had two knee replacements.  He does have history of kidney tumor, and a history of urinary tract  infections.   SOCIAL HISTORY:  He is married with three children.  He is retired.  Quit smoking in 1997.  He does not drink alcohol.   FAMILY HISTORY:  Is extremely positive for premature heart disease with  his mother dying of heart attack at 4 years old.  Father and two  brothers also have had heart disease.   REVIEW OF SYSTEMS:  Is multiply positive.  No weight loss or weight  gain, weight is 197 pounds.  He is 5 feet 7 inches tall.  VASCULAR:  Negative.  CARDIAC:  Positive as above.  GI:  Positive for blood in the stool, reflux, hiatal hernia,  constipation,  difficulty swallowing.  NEUROLOGIC:  Positive for dizziness.  PULMONARY:  HEMATOLOGIC:  Negative.  URINARY:  Positive for burning with urination and frequent urination.  ENT:  Negative.  MUSCULOSKELETAL:  Positive for arthritis and joint pain.  PSYCHIATRIC:  Negative.  SKIN:  Positive for dermatitis.   PHYSICAL EXAM:  Well-developed, well-nourished white male appearing  stated age in no acute distress.  Blood pressure 127/62.  Heart rate 72,  respirations 18.  HEENT is normal.  His carotid arteries are without  bruits bilaterally.  Chest is clear bilaterally without murmurs.  Heart:  Regular rate and rhythm.  His radial and femoral and dorsalis pedis  pulses are 2+ bilaterally.  His abdominal exam reveals no masses, no  pulsatile mass and no guarding or tenderness.  Musculoskeletal:  Shows  no major deformities or cyanosis.  He does have prior incisions from his  knee surgeries over his knees anteriorly bilaterally.  Neurologic:  No  focal weakness or paresthesias.  Skin:  Without ulcers or rashes.   I did review his duplex with him and this does show severe left carotid  stenosis.  I explained the options of combined coronary artery bypass  and carotid endarterectomy and explained this is our standard  recommendation at [facility].  He will see Dr. Donata Clay for  consultation on Monday June 13 and we will be available for  endarterectomy if required at the time of his coronary artery bypass  grafting.  I explained procedure including 1%-2% risk related to his  carotid surgery.  He understands and will proceed as directed by Dr. Donata Clay.     Larina Earthly, M.D.  Electronically Signed   TFE/MEDQ  D:  11/22/2009  T:  11/25/2009  Job:  [id number]   cc:   Antonieta Iba, MD  Kerin Perna, M.D.

## 2010-10-28 NOTE — Assessment & Plan Note (Signed)
OFFICE VISIT   HAYTHAM, [name] [name]  DOB:  19-Mar-1936                                       02/04/2010  [id number]#:[id number]   IDENTIFICATION:  Patient here for followup of his left internal carotid  artery endarterectomy in the setting of severe coronary artery disease.  He had coronary artery bypass grafting at the same procedure with Dr.  Kathlee Nations Trigt.  He had primary closure of his endarterectomy due to a  large internal carotid artery.  He did well in the hospital with no  immediate complications.  Today his left carotid incision is well-  healed.  He has no bruits bilaterally.  Blood pressure is 100/65.  He  does report some orthostatic hypotension symptoms.  He also brought my  attention to his vein harvest in his right leg.  This appears to be  healing adequately.  He does have some fluid in the tunnel from the  harvest and does clinically have thromboses of the saphenous vein from  his knee distally.  I have discussed this with the patient and explained  this will resolve spontaneously on its own.  He will see [place] again in the  6 months with repeat carotid duplex at that time.     Larina Earthly, M.D.  Electronically Signed   TFE/MEDQ  D:  02/04/2010  T:  02/05/2010  Job:  [id number]

## 2010-11-04 ENCOUNTER — Other Ambulatory Visit (INDEPENDENT_AMBULATORY_CARE_PROVIDER_SITE_OTHER): Payer: PRIVATE HEALTH INSURANCE | Admitting: *Deleted

## 2010-11-04 DIAGNOSIS — R0989 Other specified symptoms and signs involving the circulatory and respiratory systems: Secondary | ICD-10-CM

## 2010-11-05 ENCOUNTER — Other Ambulatory Visit (INDEPENDENT_AMBULATORY_CARE_PROVIDER_SITE_OTHER): Payer: PRIVATE HEALTH INSURANCE | Admitting: *Deleted

## 2010-11-05 ENCOUNTER — Telehealth: Payer: Self-pay | Admitting: Emergency Medicine

## 2010-11-05 DIAGNOSIS — Z79899 Other long term (current) drug therapy: Secondary | ICD-10-CM

## 2010-11-05 DIAGNOSIS — E785 Hyperlipidemia, unspecified: Secondary | ICD-10-CM

## 2010-11-05 LAB — HEPATIC FUNCTION PANEL
ALT: 46 U/L (ref 0–53)
AST: 25 U/L (ref 0–37)
Alkaline Phosphatase: 122 U/L — ABNORMAL HIGH (ref 39–117)
Bilirubin, Direct: 0.2 mg/dL (ref 0.0–0.3)
Indirect Bilirubin: 0.5 mg/dL (ref 0.0–0.9)
Total Protein: 5.6 g/dL — ABNORMAL LOW (ref 6.0–8.3)

## 2010-11-05 LAB — LIPID PANEL
Cholesterol: 132 mg/dL (ref 0–200)
Triglycerides: 84 mg/dL (ref <150)

## 2010-11-05 LAB — BASIC METABOLIC PANEL
BUN: 32 mg/dL — ABNORMAL HIGH (ref 6–23)
Calcium: 9 mg/dL (ref 8.4–10.5)
Creat: 1.42 mg/dL (ref 0.40–1.50)
Glucose, Bld: 194 mg/dL — ABNORMAL HIGH (ref 70–99)

## 2010-11-05 NOTE — Telephone Encounter (Signed)
He needs follow up, 6 month. Last seen 04/2010 Can talk about amio at that time

## 2010-11-05 NOTE — Telephone Encounter (Signed)
Pt called today wanting to ask you about his Amiodarone. He was looking on the Internet about this medication and he read that this medication can "hurt his lungs". Pt was wondering since he had pneumonia would this medication affect his lungs?

## 2010-11-05 NOTE — Telephone Encounter (Signed)
Pt notified and scheduled for this Friday 11/07/10 @ 11:15.

## 2010-11-06 LAB — TSH: TSH: 2.432 u[IU]/mL (ref 0.350–4.500)

## 2010-11-07 ENCOUNTER — Ambulatory Visit (INDEPENDENT_AMBULATORY_CARE_PROVIDER_SITE_OTHER): Payer: PRIVATE HEALTH INSURANCE | Admitting: Cardiovascular Disease

## 2010-11-07 ENCOUNTER — Encounter: Payer: Self-pay | Admitting: Cardiovascular Disease

## 2010-11-07 DIAGNOSIS — D869 Sarcoidosis, unspecified: Secondary | ICD-10-CM | POA: Insufficient documentation

## 2010-11-07 DIAGNOSIS — E785 Hyperlipidemia, unspecified: Secondary | ICD-10-CM

## 2010-11-07 DIAGNOSIS — Z9889 Other specified postprocedural states: Secondary | ICD-10-CM

## 2010-11-07 DIAGNOSIS — Z951 Presence of aortocoronary bypass graft: Secondary | ICD-10-CM | POA: Insufficient documentation

## 2010-11-07 DIAGNOSIS — I2510 Atherosclerotic heart disease of native coronary artery without angina pectoris: Secondary | ICD-10-CM

## 2010-11-07 DIAGNOSIS — I6529 Occlusion and stenosis of unspecified carotid artery: Secondary | ICD-10-CM

## 2010-11-07 DIAGNOSIS — R0989 Other specified symptoms and signs involving the circulatory and respiratory systems: Secondary | ICD-10-CM

## 2010-11-07 NOTE — Patient Instructions (Signed)
You are doing well. Decrease aspirin to 81 mg daily Please call us if you have new issues that need to be addressed before your next appt.  We will call you for a follow up Appt. In 6 months

## 2010-11-07 NOTE — Assessment & Plan Note (Signed)
History of carotid stenosis and carotid endarterectomy, followed in [place].

## 2010-11-07 NOTE — Assessment & Plan Note (Signed)
Cholesterol is at goal on the current lipid regimen. No changes to the medications were made.  

## 2010-11-07 NOTE — Progress Notes (Signed)
   Patient ID: CAGE GUPTON, male    DOB: 06-Jan-1936, 75 y.o.   MRN: [id number]  HPI Comments: Mr. Euceda is a 75 year old gentleman with a hx of non-ST elevation MI and was brought to the cardiac catheterization lab showing  disease, including LAD, ostial diagonal disease x2 and he was referred for bypass surgery. He had a CABG x5 with a carotid endarterectomy on the left simultaneously with Dr. Morton Peters and Dr. Arbie Cookey at [facility]. He presents for routine followup. He has been recently treated on prednisone for 90 days for sarcoid. Also recovered from a pneumonia earlier in the year.   On the prednisone, he has had significant bruising. He also reports having occasional blood in his stools. His arms are very bruised but he continues to take aspirin 325 mg daily. He has documentation of recent creatinine was 2.2 in March of this year. He denies any significant chest pain, shortness of breath, edema. He's been trying to watch his diabetes numbers the prednisone has caused these numbers to climb   Carotid U/S:  severe disease of the left internal carotid artery estimated at 80-99%, now s/p CEA  EKG shows normal sinus rhythm with rate 68 beats per minute with no significant ST or T wave changes        Review of Systems  Constitutional: Negative.   HENT: Negative.   Eyes: Negative.   Respiratory: Negative.   Cardiovascular: Negative.   Gastrointestinal: Negative.   Musculoskeletal: Negative.   Skin: Positive for color change.  Neurological: Negative.   Hematological: Negative.   Psychiatric/Behavioral: Negative.   All other systems reviewed and are negative.    BP 122/72  Pulse 68  Ht 5\' 6"  (1.676 m)  Wt 169 lb (76.658 kg)  BMI 27.28 kg/m2   Physical Exam  Nursing note and vitals reviewed. Constitutional: He is oriented to person, place, and time. He appears well-developed and well-nourished.  HENT:  Head: Normocephalic.  Nose: Nose normal.  Mouth/Throat: Oropharynx is  clear and moist.  Eyes: Conjunctivae are normal. Pupils are equal, round, and reactive to light.  Neck: Normal range of motion. Neck supple. No JVD present.  Cardiovascular: Normal rate, regular rhythm, S1 normal, S2 normal, normal heart sounds and intact distal pulses.  Exam reveals no gallop and no friction rub.   No murmur heard. Pulmonary/Chest: Effort normal and breath sounds normal. No respiratory distress. He has no wheezes. He has no rales. He exhibits no tenderness.  Abdominal: Soft. Bowel sounds are normal. He exhibits no distension. There is no tenderness.  Musculoskeletal: Normal range of motion. He exhibits no edema and no tenderness.  Lymphadenopathy:    He has no cervical adenopathy.  Neurological: He is alert and oriented to person, place, and time. Coordination normal.  Skin: Skin is warm and dry. No rash noted. No erythema.       Significant ecchymosis and bruising on both arms bilaterally  Psychiatric: He has a normal mood and affect. His behavior is normal. Judgment and thought content normal.           Assessment and Plan

## 2010-11-07 NOTE — Assessment & Plan Note (Signed)
Carney on a long course of prednisone. He is asymptomatic apart from the side effects from the prednisone.

## 2010-11-07 NOTE — Assessment & Plan Note (Addendum)
Currently with no symptoms of angina. No further workup at this time. Continue current medication regimen. Given his significant bruising, we have suggested he decrease his aspirin to 81 mg daily. He also reports occasional blood in his stools. He is on prednisone and would be at higher risk of GI pathology.

## 2010-11-11 ENCOUNTER — Encounter: Payer: Self-pay | Admitting: Cardiovascular Disease

## 2010-11-26 ENCOUNTER — Other Ambulatory Visit: Payer: Self-pay

## 2011-03-12 ENCOUNTER — Other Ambulatory Visit: Payer: Self-pay | Admitting: Cardiovascular Disease

## 2011-03-17 ENCOUNTER — Telehealth: Payer: Self-pay

## 2011-03-17 NOTE — Telephone Encounter (Signed)
Refill sent on metoprolol

## 2011-05-01 ENCOUNTER — Encounter: Payer: Self-pay | Admitting: Cardiovascular Disease

## 2011-05-13 ENCOUNTER — Ambulatory Visit: Payer: PRIVATE HEALTH INSURANCE | Admitting: Cardiovascular Disease

## 2011-06-11 ENCOUNTER — Encounter: Payer: Self-pay | Admitting: Cardiovascular Disease

## 2011-09-02 ENCOUNTER — Other Ambulatory Visit: Payer: PRIVATE HEALTH INSURANCE

## 2011-09-09 ENCOUNTER — Other Ambulatory Visit: Payer: PRIVATE HEALTH INSURANCE

## 2011-09-14 ENCOUNTER — Ambulatory Visit (INDEPENDENT_AMBULATORY_CARE_PROVIDER_SITE_OTHER): Payer: PRIVATE HEALTH INSURANCE | Admitting: Cardiovascular Disease

## 2011-09-14 ENCOUNTER — Encounter: Payer: Self-pay | Admitting: Cardiovascular Disease

## 2011-09-14 VITALS — BP 100/62 | HR 73 | Resp 18 | Ht 67.0 in | Wt 170.1 lb

## 2011-09-14 DIAGNOSIS — Z951 Presence of aortocoronary bypass graft: Secondary | ICD-10-CM

## 2011-09-14 DIAGNOSIS — I6529 Occlusion and stenosis of unspecified carotid artery: Secondary | ICD-10-CM

## 2011-09-14 DIAGNOSIS — I2510 Atherosclerotic heart disease of native coronary artery without angina pectoris: Secondary | ICD-10-CM

## 2011-09-14 DIAGNOSIS — E785 Hyperlipidemia, unspecified: Secondary | ICD-10-CM

## 2011-09-14 NOTE — Assessment & Plan Note (Signed)
Status post carotid endarterectomy at the time of his bypass. He is scheduled for carotid study in [place] though has indicated he did not want to drive to [place] from [place] where he lives. He would like to do this in [place]. At his request, we will try to schedule this through our office in [place]. We will forward the results on to Dr. Arbie Cookey.

## 2011-09-14 NOTE — Progress Notes (Signed)
Patient ID: [name] [name], male    DOB: [birth year], 76 y.o.   MRN: [id number]  HPI Comments: Mr. Pamer is a 76 year old gentleman with a hx of non-ST elevation MI and was brought to the cardiac catheterization lab showing  Severe CAD, including LAD, ostial diagonal disease x2 and he was referred for bypass surgery. He had a CABG x5 with a carotid endarterectomy on the left simultaneously with Dr. Morton Peters and Dr. Arbie Cookey at [facility]. He presents for routine followup. He has been treated on prednisone for 2 years for suspected sarcoid. pneumonia earlier in 2012.   He reports that overall he has been doing well. He denies any significant shortness of breath or chest pain. He did smoke for many years but stopped at age 76. He continues to be on low dose prednisone and has significant bruising. He was told to stop his aspirin secondary to the bruising. His wife has had advanced dementia and she now lives in assisted living. Previous cholesterol is well-controlled and he reports having recent lab work that showed his numbers are still excellent. Overall he has no new complaints apart from the prednisone and bruising.  Carotid U/S:  severe disease of the left internal carotid artery estimated at 80-99%, now s/p CEA  EKG shows normal sinus rhythm with rate 79 beats per minute with no significant ST or T wave changes      Outpatient Encounter Prescriptions as of 09/14/2011  Medication Sig Dispense Refill  . Acetaminophen-Codeine (TYLENOL/CODEINE #3) 300-30 MG per tablet Take 1 tablet by mouth every 4 (four) hours as needed.        Marland Kitchen aspirin 325 MG tablet Take 325 mg by mouth daily.        . Cholecalciferol (VITAMIN D3) 2000 UNITS TABS Take 2,000 Units by mouth daily.        Marland Kitchen docusate sodium (COLACE) 100 MG capsule Take 100 mg by mouth daily.        . finasteride (PROSCAR) 5 MG tablet Take 5 mg by mouth daily as needed.       . furosemide (LASIX) 40 MG tablet Take 40 mg by mouth daily. As needed.        Marland Kitchen glipiZIDE (GLUCOTROL) 10 MG tablet Take 20 mg by mouth 2 (two) times daily before a meal.       . HYDROXYZINE HCL PO Take 10 mg by mouth 2 (two) times daily. As needed        . metoprolol (LOPRESSOR) 50 MG tablet TAKE ONE TABLET BY MOUTH TWICE DAILY  60 tablet  6  . omeprazole (PRILOSEC) 20 MG capsule Take 20 mg by mouth daily.        Marland Kitchen rOPINIRole (REQUIP) 0.5 MG tablet 0.5 mg. At bedtime.       . rosuvastatin (CRESTOR) 10 MG tablet Take 5 mg by mouth daily.       Marland Kitchen Specialty Vitamins Products ([employer] PROSTATE HEALTH FORMULA PO) Take 1 capsule by mouth daily.        . vitamin B-12 (CYANOCOBALAMIN) 1000 MCG tablet 1,000 mcg. Take two tablets daily.          Review of Systems  Constitutional: Negative.   HENT: Negative.   Eyes: Negative.   Respiratory: Negative.   Cardiovascular: Negative.   Gastrointestinal: Negative.   Musculoskeletal: Negative.   Skin: Positive for color change.  Neurological: Negative.   Hematological: Negative.   Psychiatric/Behavioral: Negative.   All other systems reviewed and are negative.  BP 100/62  Pulse 73  Resp 18  Ht 5\' 7"  (1.702 m)  Wt 170 lb 1.9 oz (77.166 kg)  BMI 26.64 kg/m2  Physical Exam  Nursing note and vitals reviewed. Constitutional: He is oriented to person, place, and time. He appears well-developed and well-nourished.  HENT:  Head: Normocephalic.  Nose: Nose normal.  Mouth/Throat: Oropharynx is clear and moist.  Eyes: Conjunctivae are normal. Pupils are equal, round, and reactive to light.  Neck: Normal range of motion. Neck supple. No JVD present.  Cardiovascular: Normal rate, regular rhythm, S1 normal, S2 normal, normal heart sounds and intact distal pulses.  Exam reveals no gallop and no friction rub.   No murmur heard. Pulmonary/Chest: Effort normal and breath sounds normal. No respiratory distress. He has no wheezes. He has no rales. He exhibits no tenderness.  Abdominal: Soft. Bowel sounds are normal. He exhibits no  distension. There is no tenderness.  Musculoskeletal: Normal range of motion. He exhibits no edema and no tenderness.  Lymphadenopathy:    He has no cervical adenopathy.  Neurological: He is alert and oriented to person, place, and time. Coordination normal.  Skin: Skin is warm and dry. No rash noted. No erythema.       Significant ecchymosis and bruising on both arms bilaterally  Psychiatric: He has a normal mood and affect. His behavior is normal. Judgment and thought content normal.           Assessment and Plan

## 2011-09-14 NOTE — Patient Instructions (Signed)
You are doing well. Please start aspirin 81 mg daily  Please call us if you have new issues that need to be addressed before your next appt.  Your physician wants you to follow-up in: 6 months.  You will receive a reminder letter in the mail two months in advance. If you don't receive a letter, please call our office to schedule the follow-up appointment.

## 2011-09-14 NOTE — Assessment & Plan Note (Signed)
Cholesterol is at goal on the current lipid regimen. No changes to the medications were made.  

## 2011-09-14 NOTE — Assessment & Plan Note (Signed)
Currently with no symptoms of angina. No further workup at this time. Continue current medication regimen. 

## 2011-09-23 ENCOUNTER — Other Ambulatory Visit: Payer: PRIVATE HEALTH INSURANCE

## 2011-10-01 ENCOUNTER — Encounter (INDEPENDENT_AMBULATORY_CARE_PROVIDER_SITE_OTHER): Payer: PRIVATE HEALTH INSURANCE

## 2011-10-01 DIAGNOSIS — I6529 Occlusion and stenosis of unspecified carotid artery: Secondary | ICD-10-CM

## 2011-10-05 IMAGING — CR DG CHEST 2V
1 series · 3 of 3 positions shown · non-contrast
Comparison: none

REASON FOR EXAM: chills and weakness
COMMENTS:

[Series 1: view not recorded · 0.17mm/px · 3 of 3 slices shown]
[im 1/3]
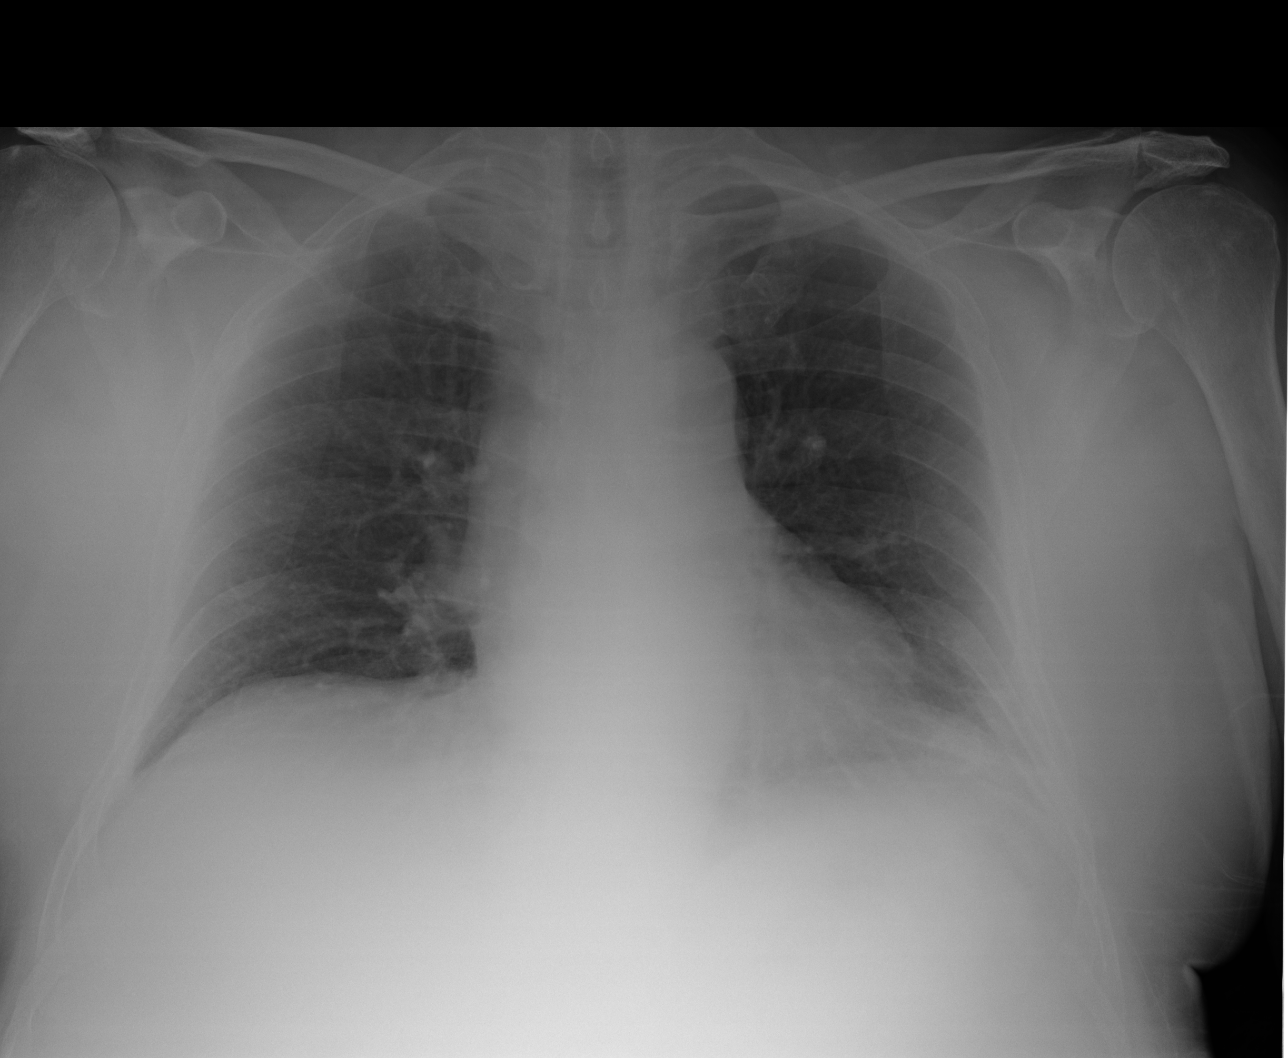
[im 2/3]
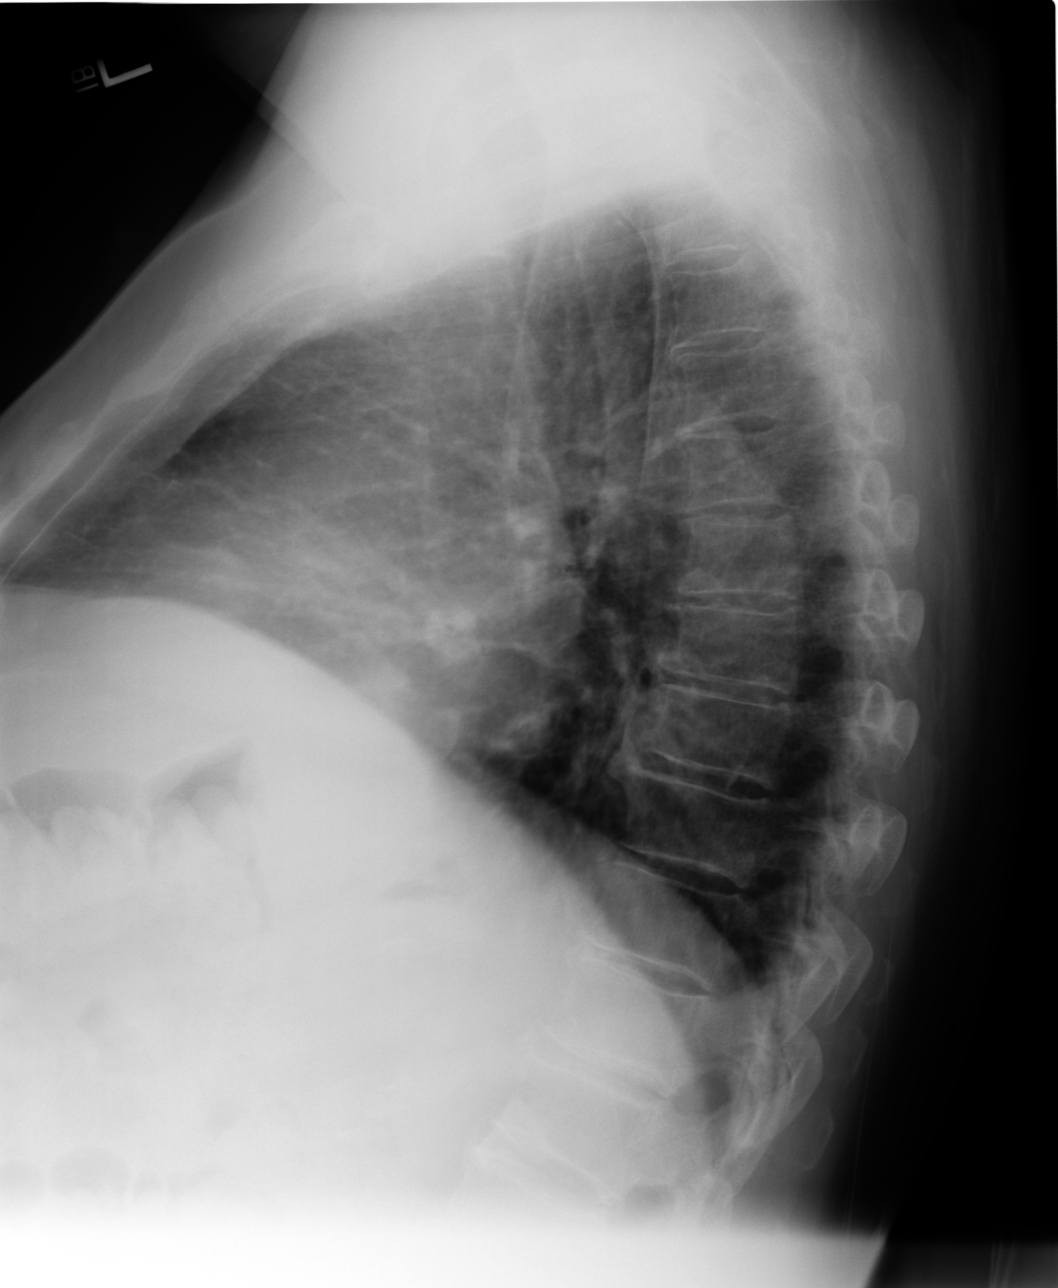
[im 3/3]
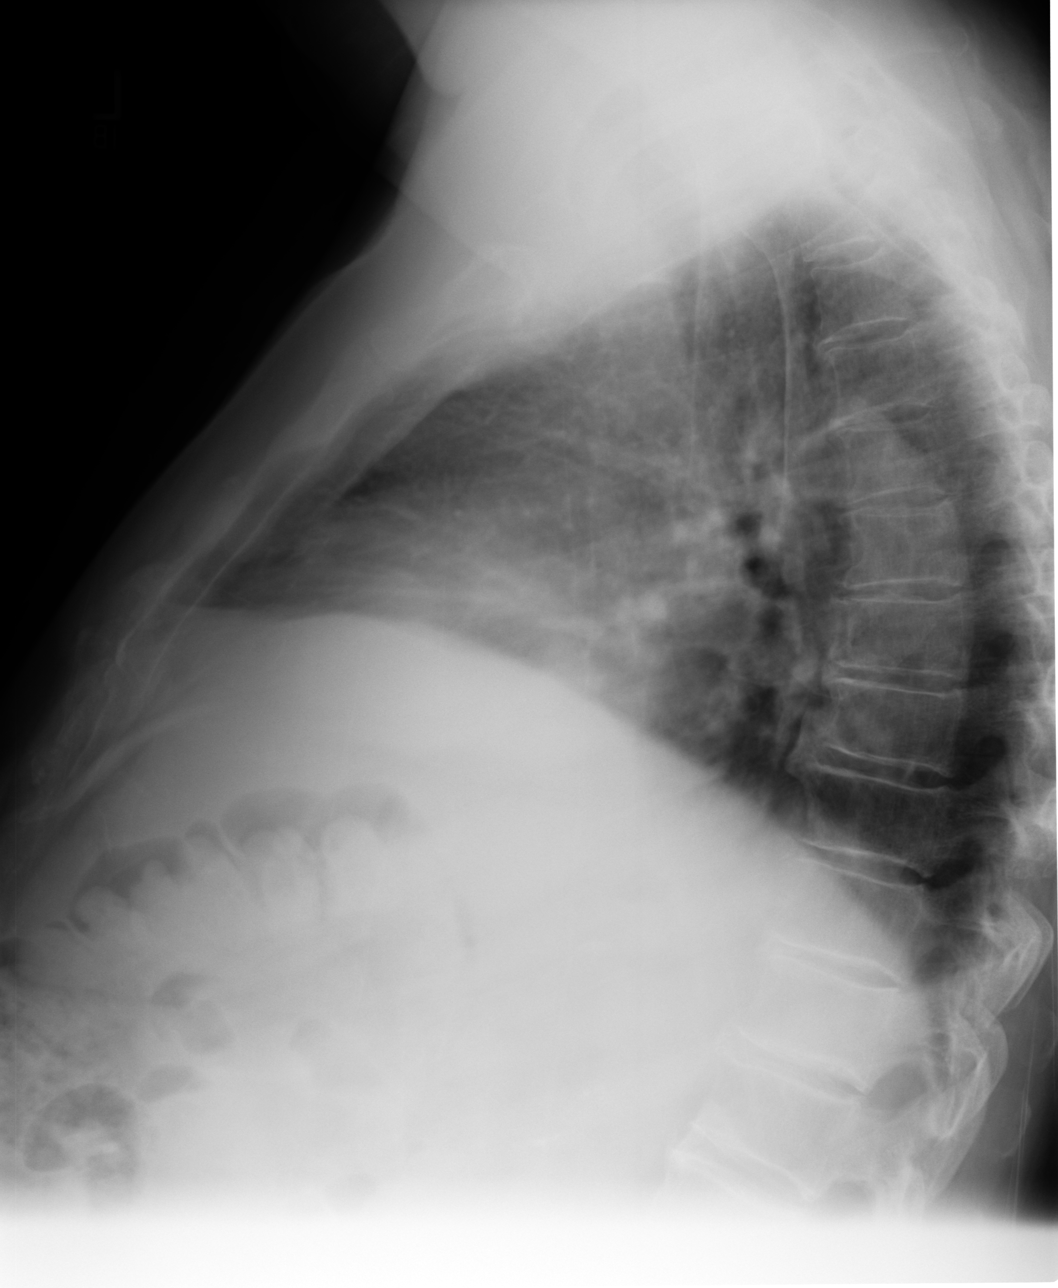

[3 of 3 positions shown; findings below may reference images not displayed]

PROCEDURE:     DXR - DXR CHEST PA (OR AP) AND LATERAL  - November 14, 2009  [DATE]

RESULT:     There are no prior examinations for comparison. The current exam
shows haziness at the left lower lung field. The findings most likely are
chronic but the possibility of an early infiltrate in the left base or
lingula cannot be totally excluded at this point and follow-up examination
is recommended if symptomatology persists. The lung fields otherwise are
clear. Heart size is normal. The mediastinal and osseous structures are
normal in appearance.
IMPRESSION: 1.     There is nonspecific haziness at the left base, likely chronic but
early pneumonia at the left base or lingula cannot be totally excluded on
this exam and follow-up is recommended if symptomatology persists.

## 2011-10-19 IMAGING — CR DG CHEST 1V PORT
1 series · 1 of 1 positions shown · non-contrast
Comparison: 11/26/2009.

CLINICAL DATA: 73-year-old male status post CABG.

PORTABLE CHEST - 1 VIEW

[view not recorded]
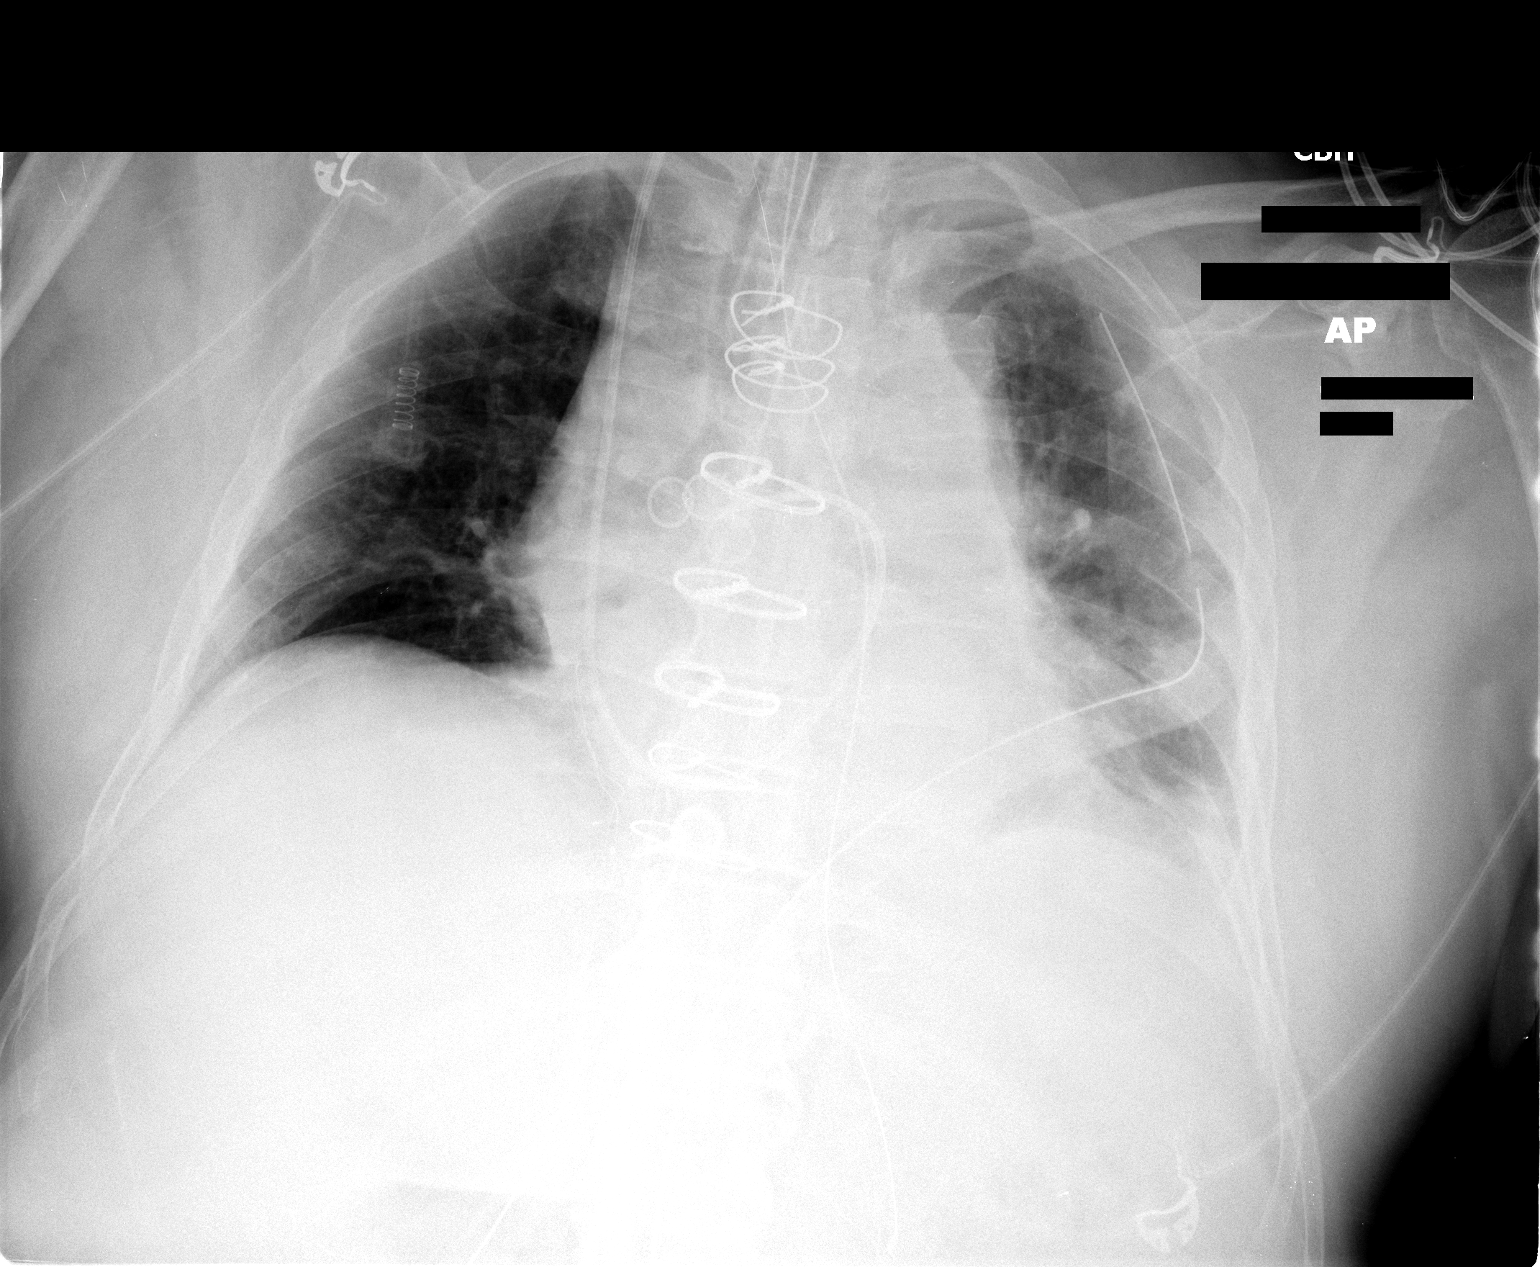

[1 of 1 positions shown; findings below may reference images not displayed]

FINDINGS: Portable semi upright AP view at 6471 hours.
Endotracheal tube tip projects between level of clavicles and
carina.  Right IJ approach Swan-Ganz catheter, tip at the level of
the right main pulmonary artery.  Enteric tube in place, coursing
to the abdomen.  Side hole at the level of the gastric fundus.
Mediastinal drain and left chest tube in place.

Median sternotomy wires and sequelae of CABG.  Epicardial pacer
wires.  Low lung volumes.  Probable small left pleural effusion.
Left greater than right atelectasis.  No pneumothorax or pulmonary
edema.
IMPRESSION: 1.  Lines and tubes appear per early placed as above.
2.  Low lung volumes with atelectasis.  Probable small left pleural
effusion.  No pneumothorax identified.

## 2011-10-22 IMAGING — CR DG CHEST 1V PORT
1 series · 1 of 1 positions shown · non-contrast
Comparison: 11/30/2009.

CLINICAL DATA: Status post CABG.

PORTABLE CHEST - 1 VIEW

[AP]
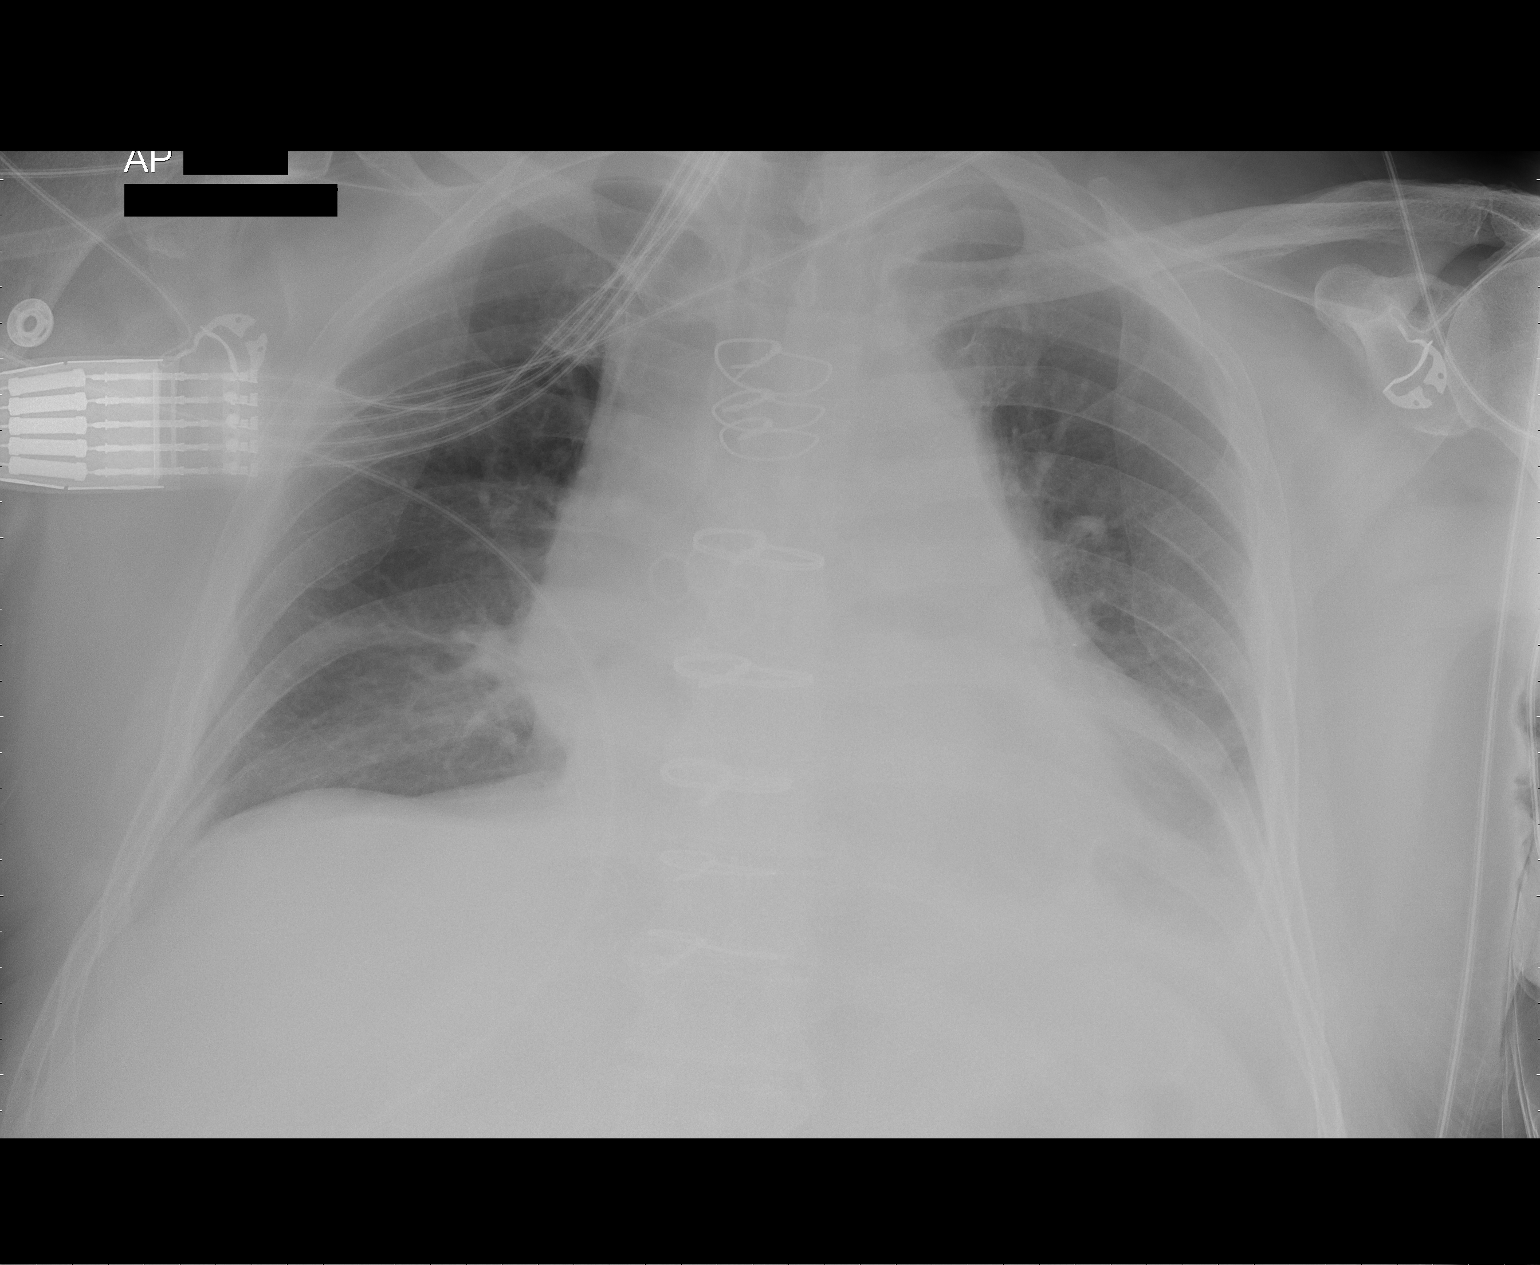

[1 of 1 positions shown; findings below may reference images not displayed]

FINDINGS: Right central line has been removed.  Prominence of the
mediastinal and cardiac silhouette unchanged.  Mild central
pulmonary vascular prominence.  Left base atelectasis. Small left-
sided pleural effusion.  No gross pneumothorax.  Limited evaluation
of the descending thoracic aorta.
IMPRESSION: Right central line has been removed.  Remainder of findings without
change.

## 2011-11-15 IMAGING — CR DG CHEST 2V
2 series · 2 of 2 positions shown · non-contrast
Comparison: Portable chest x-ray of 12/05/2009

CLINICAL DATA: Status post CABG, follow-up

CHEST - 2 VIEW

[w chest pa]
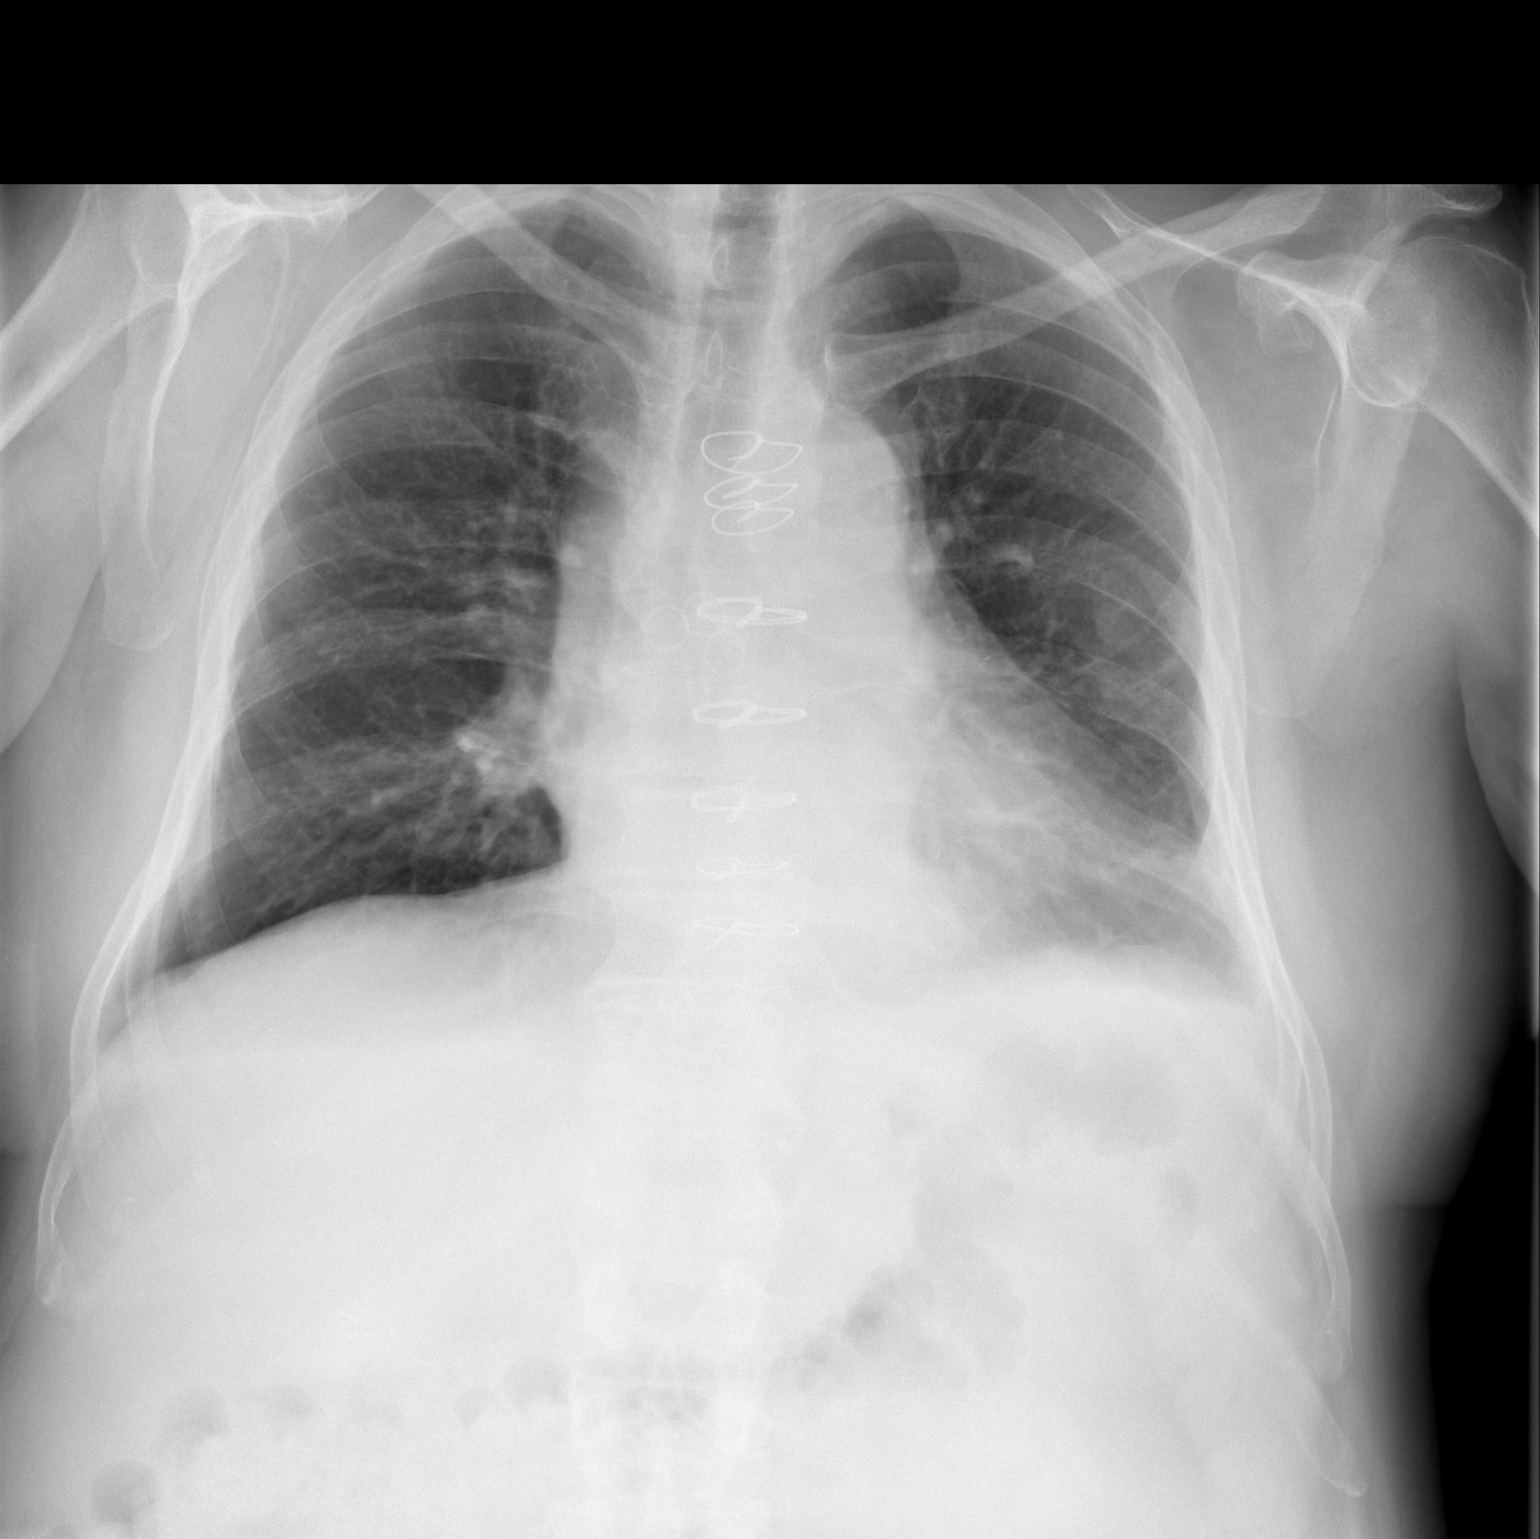

[w chest lat]
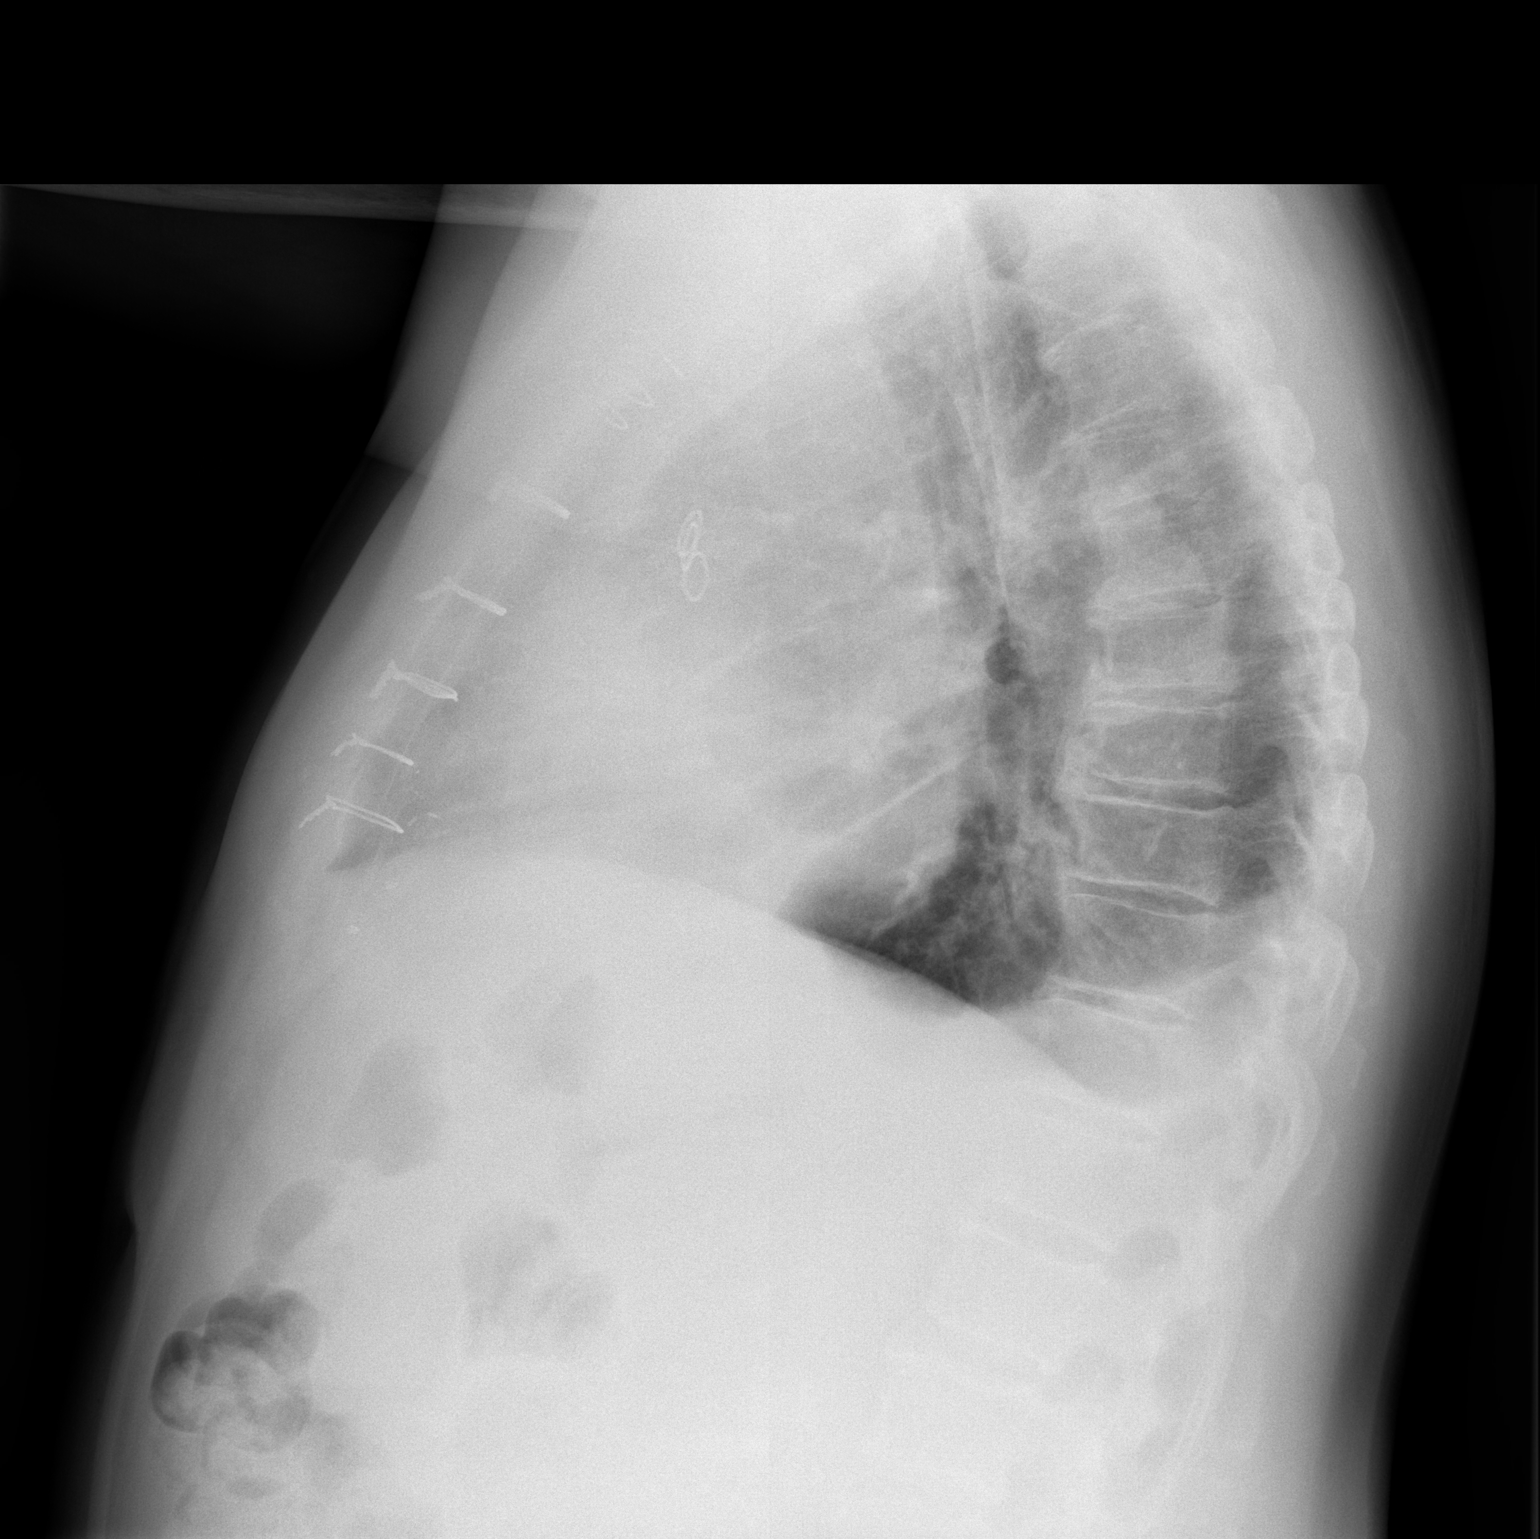

[2 of 2 positions shown; findings below may reference images not displayed]

FINDINGS: Aeration has improved.  Small pleural effusions remain
left slightly larger than right with mild left basilar atelectasis.
Cardiomegaly is stable.  Median sternotomy sutures appear intact.
IMPRESSION: Improved aeration with small effusions remaining left greater than
right.

## 2012-03-14 ENCOUNTER — Encounter: Payer: Self-pay | Admitting: Cardiovascular Disease

## 2012-03-14 ENCOUNTER — Ambulatory Visit (INDEPENDENT_AMBULATORY_CARE_PROVIDER_SITE_OTHER): Payer: Medicare Other | Admitting: Cardiovascular Disease

## 2012-03-14 VITALS — BP 120/68 | HR 87 | Ht 67.0 in | Wt 168.0 lb

## 2012-03-14 DIAGNOSIS — D869 Sarcoidosis, unspecified: Secondary | ICD-10-CM

## 2012-03-14 DIAGNOSIS — I2510 Atherosclerotic heart disease of native coronary artery without angina pectoris: Secondary | ICD-10-CM

## 2012-03-14 DIAGNOSIS — Z951 Presence of aortocoronary bypass graft: Secondary | ICD-10-CM

## 2012-03-14 DIAGNOSIS — E785 Hyperlipidemia, unspecified: Secondary | ICD-10-CM

## 2012-03-14 DIAGNOSIS — I6529 Occlusion and stenosis of unspecified carotid artery: Secondary | ICD-10-CM

## 2012-03-14 MED ORDER — METOPROLOL TARTRATE 25 MG PO TABS: 25.0000 mg | ORAL_TABLET | Freq: Two times a day (BID) | ORAL | Status: DC

## 2012-03-14 NOTE — Assessment & Plan Note (Signed)
Cholesterol is at goal on the current lipid regimen. No changes to the medications were made.  

## 2012-03-14 NOTE — Progress Notes (Signed)
Patient ID: [name] [name], male    DOB: 1935-07-31, 76 y.o.   MRN: [id number]  HPI Comments: [name] [name] is a 76 year old gentleman with a hx of non-ST elevation MI and was brought to the cardiac catheterization lab showing  Severe CAD, including LAD, ostial diagonal disease x2 and [name] [name] was referred for bypass surgery. [name] [name] had a CABG x5 with a carotid endarterectomy on the left simultaneously with Dr. Morton Peters and Dr. Arbie Cookey at [facility]. [name] [name] presents for routine followup. [name] [name] has been treated on prednisone for 2 years for suspected sarcoid. [name] [name] had pneumonia earlier in 2012.   [name] [name] reports that overall [name] [name] has been doing well. [name] [name] denies any significant shortness of breath or chest pain. [name] [name] did smoke for many years but stopped at age 76. Prednisone was held several months ago. [name] [name] does report having bilateral shoulder pain worse on the right than the left. [name] [name] sees a specialist for this. [name] [name] has decreased his metoprolol to 25 mg in the morning as his blood pressure was running low.   Carotid U/S:  severe disease of the left internal carotid artery estimated at 80-99%, now s/p CEA Most recent carotid ultrasound showed 40-59% disease, right greater than left  EKG shows normal sinus rhythm with rate 87 beats per minute with no significant ST or T wave changes   Outpatient Encounter Prescriptions as of 03/14/2012  Medication Sig Dispense Refill  . acetaminophen (TYLENOL) 325 MG tablet Take 325 mg by mouth every 6 (six) hours as needed.      Marland Kitchen aspirin 81 MG tablet Take 81 mg by mouth daily.      . Cholecalciferol (VITAMIN D3) 2000 UNITS TABS Take 2,000 Units by mouth daily.        Marland Kitchen docusate sodium (COLACE) 100 MG capsule Take 100 mg by mouth daily.        . finasteride (PROSCAR) 5 MG tablet Take 5 mg by mouth daily as needed.       . furosemide (LASIX) 40 MG tablet Take 40 mg by mouth daily. As needed.       Marland Kitchen HYDROXYZINE HCL PO Take 10 mg by mouth 2 (two) times daily. As needed        . Magnesium 200 MG  TABS Take 200 mg by mouth daily.      . metoprolol (LOPRESSOR) 50 MG tablet TAKE ONE TABLET BY MOUTH TWICE DAILY  60 tablet  6  . omeprazole (PRILOSEC) 20 MG capsule Take 40 mg by mouth 2 (two) times daily.       Marland Kitchen rOPINIRole (REQUIP) 0.5 MG tablet 0.5 mg. At bedtime.       . rosuvastatin (CRESTOR) 10 MG tablet Take 5 mg by mouth daily.       Marland Kitchen Specialty Vitamins Products ([employer] PROSTATE HEALTH FORMULA PO) Take 1 capsule by mouth daily.        . vitamin B-12 (CYANOCOBALAMIN) 1000 MCG tablet 1,000 mcg. Take two tablets daily.       . vitamin C (ASCORBIC ACID) 500 MG tablet Take 500 mg by mouth daily.      Marland Kitchen DISCONTD: Acetaminophen-Codeine (TYLENOL/CODEINE #3) 300-30 MG per tablet Take 1 tablet by mouth every 4 (four) hours as needed.        Marland Kitchen DISCONTD: aspirin 325 MG tablet Take 325 mg by mouth daily.        Marland Kitchen DISCONTD: glipiZIDE (GLUCOTROL) 10 MG tablet Take 20 mg by mouth 2 (two) times daily before a  meal.       . DISCONTD: Magnesium 500 MG TABS Take 1 tablet by mouth daily.      Marland Kitchen DISCONTD: predniSONE (DELTASONE) 10 MG tablet Take 10 mg by mouth daily.         Review of Systems  Constitutional: Negative.   HENT: Negative.   Eyes: Negative.   Respiratory: Negative.   Cardiovascular: Negative.   Gastrointestinal: Negative.   Musculoskeletal: Negative.   Skin: Positive for color change.  Neurological: Negative.   Hematological: Negative.   Psychiatric/Behavioral: Negative.   All other systems reviewed and are negative.    BP 120/68  Pulse 87  Ht 5\' 7"  (1.702 m)  Wt 168 lb (76.204 kg)  BMI 26.31 kg/m2  Physical Exam  Nursing note and vitals reviewed. Constitutional: [name] [name] is oriented to person, place, and time. [name] [name] appears well-developed and well-nourished.  HENT:  Head: Normocephalic.  Nose: Nose normal.  Mouth/Throat: Oropharynx is clear and moist.  Eyes: Conjunctivae normal are normal. Pupils are equal, round, and reactive to light.  Neck: Normal range of motion. Neck supple.  No JVD present.  Cardiovascular: Normal rate, regular rhythm, S1 normal, S2 normal, normal heart sounds and intact distal pulses.  Exam reveals no gallop and no friction rub.   No murmur heard. Pulmonary/Chest: Effort normal and breath sounds normal. No respiratory distress. [name] [name] has no wheezes. [name] [name] has no rales. [name] [name] exhibits no tenderness.  Abdominal: Soft. Bowel sounds are normal. [name] [name] exhibits no distension. There is no tenderness.  Musculoskeletal: Normal range of motion. [name] [name] exhibits no edema and no tenderness.  Lymphadenopathy:    [name] [name] has no cervical adenopathy.  Neurological: [name] [name] is alert and oriented to person, place, and time. Coordination normal.  Skin: Skin is warm and dry. No rash noted. No erythema.       Significant ecchymosis and bruising on both arms bilaterally  Psychiatric: [name] [name] has a normal mood and affect. His behavior is normal. Judgment and thought content normal.           Assessment and Plan

## 2012-03-14 NOTE — Assessment & Plan Note (Signed)
Prednisone has been discontinued. He did a course of 2 years

## 2012-03-14 NOTE — Assessment & Plan Note (Signed)
Currently with no symptoms of angina. No further workup at this time. Continue current medication regimen. 

## 2012-03-14 NOTE — Patient Instructions (Addendum)
You are doing well. Please continue the metoprolol 12.5 mg up to 25 mg up to twice a day  Please call us if you have new issues that need to be addressed before your next appt.  Your physician wants you to follow-up in: 6 months.  You will receive a reminder letter in the mail two months in advance. If you don't receive a letter, please call our office to schedule the follow-up appointment.

## 2012-03-14 NOTE — Assessment & Plan Note (Signed)
We'll continue aggressive cholesterol management. Repeat carotid ultrasound next year.

## 2012-10-05 ENCOUNTER — Encounter (INDEPENDENT_AMBULATORY_CARE_PROVIDER_SITE_OTHER): Payer: Medicare Other

## 2012-10-05 DIAGNOSIS — I6529 Occlusion and stenosis of unspecified carotid artery: Secondary | ICD-10-CM

## 2012-10-10 ENCOUNTER — Ambulatory Visit (INDEPENDENT_AMBULATORY_CARE_PROVIDER_SITE_OTHER): Payer: Medicare Other | Admitting: Cardiovascular Disease

## 2012-10-10 ENCOUNTER — Encounter: Payer: Self-pay | Admitting: Cardiovascular Disease

## 2012-10-10 VITALS — BP 98/60 | HR 80 | Ht 67.0 in | Wt 174.2 lb

## 2012-10-10 DIAGNOSIS — Z951 Presence of aortocoronary bypass graft: Secondary | ICD-10-CM

## 2012-10-10 DIAGNOSIS — I959 Hypotension, unspecified: Secondary | ICD-10-CM | POA: Insufficient documentation

## 2012-10-10 DIAGNOSIS — I6529 Occlusion and stenosis of unspecified carotid artery: Secondary | ICD-10-CM

## 2012-10-10 DIAGNOSIS — E785 Hyperlipidemia, unspecified: Secondary | ICD-10-CM

## 2012-10-10 DIAGNOSIS — I2510 Atherosclerotic heart disease of native coronary artery without angina pectoris: Secondary | ICD-10-CM

## 2012-10-10 NOTE — Assessment & Plan Note (Signed)
Currently with no symptoms of angina. No further workup at this time. Continue current medication regimen. 

## 2012-10-10 NOTE — Patient Instructions (Addendum)
You are doing well. Restart aspirin daily Take the flomax at night before bed for prostate  If you get dizzy episodes, take in more salt  Please call us if you have new issues that need to be addressed before your next appt.  Your physician wants you to follow-up in: 6 months.  You will receive a reminder letter in the mail two months in advance. If you don't receive a letter, please call our office to schedule the follow-up appointment.

## 2012-10-10 NOTE — Assessment & Plan Note (Signed)
Cholesterol is at goal on the current lipid regimen. No changes to the medications were made.  

## 2012-10-10 NOTE — Assessment & Plan Note (Signed)
Blood pressure is low today. Rare dizziness. We have suggested he take his prostate medication in the evening

## 2012-10-10 NOTE — Assessment & Plan Note (Signed)
Carotid endarterectomy on the left. Continue aggressive cholesterol management

## 2012-10-10 NOTE — Progress Notes (Signed)
Patient ID: [name] [name], male    DOB: 1935/10/26, 77 y.o.   MRN: [id number]  HPI Comments: Mr. Heidenreich is a 77 year old gentleman with a hx of non-ST elevation MI and was brought to the cardiac catheterization lab showing  Severe CAD, including LAD, ostial diagonal disease x2 and he was referred for bypass surgery. He had a CABG x5 with a carotid endarterectomy on the left simultaneously with Dr. Morton Peters and Dr. Arbie Cookey at [facility]. He presents for routine followup. He has been treated on prednisone for 2 years for suspected sarcoid. He had pneumonia earlier in 2012.   He reports that overall he has been doing well. He denies any significant shortness of breath or chest pain. He did smoke for many years but stopped at age 18.  He continues to take NSAIDs for his shoulder pain. Stop aspirin short time ago. Occasional lightheaded spells but otherwise doing well. Active, does not do any regular exercise.  Carotid U/S:  severe disease of the left internal carotid artery estimated at 80-99%, now s/p CEA Most recent carotid ultrasound showed 40-59% disease, right greater than left  EKG shows normal sinus rhythm with rate 80 beats per minute with no significant ST or T wave changes   Outpatient Encounter Prescriptions as of 10/10/2012  Medication Sig Dispense Refill  . acetaminophen (TYLENOL) 325 MG tablet Take 325 mg by mouth every 6 (six) hours as needed.      Marland Kitchen aspirin 81 MG tablet Take 81 mg by mouth daily.      . Cholecalciferol (VITAMIN D3) 2000 UNITS TABS Take 2,000 Units by mouth daily.        Marland Kitchen docusate sodium (COLACE) 100 MG capsule Take 100 mg by mouth daily.        . furosemide (LASIX) 40 MG tablet Take 40 mg by mouth daily. As needed.       Marland Kitchen HYDROXYZINE HCL PO Take 10 mg by mouth 2 (two) times daily. As needed        . Magnesium 200 MG TABS Take 200 mg by mouth daily.      . metoprolol (LOPRESSOR) 25 MG tablet Take 1 tablet (25 mg total) by mouth 2 (two) times daily.  180 tablet   3  . omeprazole (PRILOSEC) 20 MG capsule Take 40 mg by mouth 2 (two) times daily.       Marland Kitchen rOPINIRole (REQUIP) 0.5 MG tablet 0.5 mg. At bedtime.       . rosuvastatin (CRESTOR) 10 MG tablet Take 5 mg by mouth daily.       Marland Kitchen Specialty Vitamins Products ([employer]S PROSTATE HEALTH FORMULA PO) Take 1 capsule by mouth daily.        . tamsulosin (FLOMAX) 0.4 MG CAPS Take 0.4 mg by mouth daily.      . vitamin B-12 (CYANOCOBALAMIN) 1000 MCG tablet 1,000 mcg. Take two tablets daily.       . vitamin C (ASCORBIC ACID) 500 MG tablet Take 500 mg by mouth daily.      . [DISCONTINUED] finasteride (PROSCAR) 5 MG tablet Take 5 mg by mouth daily as needed.        No facility-administered encounter medications on file as of 10/10/2012.     Review of Systems  Constitutional: Negative.   HENT: Negative.   Eyes: Negative.   Respiratory: Negative.   Cardiovascular: Negative.   Gastrointestinal: Negative.   Musculoskeletal: Negative.   Skin: Positive for color change.  Neurological: Negative.   Psychiatric/Behavioral:  Negative.   All other systems reviewed and are negative.    BP 98/60  Pulse 80  Ht 5\' 7"  (1.702 m)  Wt 174 lb 4 oz (79.039 kg)  BMI 27.28 kg/m2  Physical Exam  Nursing note and vitals reviewed. Constitutional: He is oriented to person, place, and time. He appears well-developed and well-nourished.  HENT:  Head: Normocephalic.  Nose: Nose normal.  Mouth/Throat: Oropharynx is clear and moist.  Eyes: Conjunctivae are normal. Pupils are equal, round, and reactive to light.  Neck: Normal range of motion. Neck supple. No JVD present.  Cardiovascular: Normal rate, regular rhythm, S1 normal, S2 normal, normal heart sounds and intact distal pulses.  Exam reveals no gallop and no friction rub.   No murmur heard. Pulmonary/Chest: Effort normal and breath sounds normal. No respiratory distress. He has no wheezes. He has no rales. He exhibits no tenderness.  Abdominal: Soft. Bowel sounds are normal.  He exhibits no distension. There is no tenderness.  Musculoskeletal: Normal range of motion. He exhibits no edema and no tenderness.  Lymphadenopathy:    He has no cervical adenopathy.  Neurological: He is alert and oriented to person, place, and time. Coordination normal.  Skin: Skin is warm and dry. No rash noted. No erythema.  Significant ecchymosis and bruising on both arms bilaterally  Psychiatric: He has a normal mood and affect. His behavior is normal. Judgment and thought content normal.      Assessment and Plan

## 2013-04-13 ENCOUNTER — Encounter: Payer: Self-pay | Admitting: Cardiovascular Disease

## 2013-04-13 ENCOUNTER — Ambulatory Visit (INDEPENDENT_AMBULATORY_CARE_PROVIDER_SITE_OTHER): Payer: Medicare Other | Admitting: Cardiovascular Disease

## 2013-04-13 VITALS — BP 108/60 | HR 69 | Ht 67.0 in | Wt 176.8 lb

## 2013-04-13 DIAGNOSIS — D869 Sarcoidosis, unspecified: Secondary | ICD-10-CM

## 2013-04-13 DIAGNOSIS — I2510 Atherosclerotic heart disease of native coronary artery without angina pectoris: Secondary | ICD-10-CM

## 2013-04-13 DIAGNOSIS — E785 Hyperlipidemia, unspecified: Secondary | ICD-10-CM

## 2013-04-13 DIAGNOSIS — I6529 Occlusion and stenosis of unspecified carotid artery: Secondary | ICD-10-CM

## 2013-04-13 NOTE — Assessment & Plan Note (Signed)
Carotid endarterectomy on the left, 40-59% disease on the right

## 2013-04-13 NOTE — Assessment & Plan Note (Signed)
No changes to the medications were made. No recent lab work available. This is done at the [place]

## 2013-04-13 NOTE — Patient Instructions (Signed)
You are doing well. No medication changes were made.  Please call us if you have new issues that need to be addressed before your next appt.  Your physician wants you to follow-up in: 6 months.  You will receive a reminder letter in the mail two months in advance. If you don't receive a letter, please call our office to schedule the follow-up appointment.   

## 2013-04-13 NOTE — Assessment & Plan Note (Signed)
Previously treated with long course of prednisone

## 2013-04-13 NOTE — Progress Notes (Signed)
Patient ID: [name] [name], male    DOB: [birth year], 77 y.o.   MRN: [id number]  HPI Comments: [name] [name] is a 77 year old gentleman with a hx of smoking, diabetes,  non-ST elevation MI and was brought to the cardiac catheterization lab showing  severe CAD, including LAD, ostial diagonal disease x2 and he was referred for bypass surgery. He had a CABG x5 with a carotid endarterectomy on the left simultaneously with Dr. Morton Peters and Dr. Arbie Cookey at [facility]. He presents for routine followup. He has been treated on prednisone in the past  For suspected sarcoid. He had pneumonia earlier in 2012.   He reports that overall he has been doing well. He denies any significant shortness of breath or chest pain. He did smoke for many years but stopped at age 77.  He continues to have chronic shoulder pain.  Active, does not do any regular exercise. He is followed at the [facility]. No recent lab work available here. Has had trouble with urinary tract infections.  Carotid U/S:  severe disease of the left internal carotid artery estimated at 80-99%, now s/p CEA Most recent carotid ultrasound showed 40-59% disease, right greater than left  EKG shows normal sinus rhythm with rate 69 beats per minute with no significant ST or T wave changes   Outpatient Encounter Prescriptions as of 04/13/2013  Medication Sig Dispense Refill  . acetaminophen (TYLENOL) 325 MG tablet Take 325 mg by mouth every 6 (six) hours as needed.      Marland Kitchen aspirin 81 MG tablet Take 81 mg by mouth daily.      . Cholecalciferol (VITAMIN D3) 2000 UNITS TABS Take 2,000 Units by mouth daily.        Marland Kitchen docusate sodium (COLACE) 100 MG capsule Take 100 mg by mouth daily.        . furosemide (LASIX) 40 MG tablet Take 40 mg by mouth daily. As needed.       Marland Kitchen HYDROXYZINE HCL PO Take 10 mg by mouth 2 (two) times daily. As needed        . Magnesium 200 MG TABS Take 200 mg by mouth daily.      . metoprolol (LOPRESSOR) 25 MG tablet Take 1 tablet (25 mg  total) by mouth 2 (two) times daily.  180 tablet  3  . omeprazole (PRILOSEC) 20 MG capsule Take 40 mg by mouth 2 (two) times daily.       Marland Kitchen rOPINIRole (REQUIP) 0.5 MG tablet 0.5 mg. At bedtime.       . rosuvastatin (CRESTOR) 10 MG tablet Take 5 mg by mouth daily.       Marland Kitchen Specialty Vitamins Products ([employer] PROSTATE HEALTH FORMULA PO) Take 1 capsule by mouth daily.        . vitamin B-12 (CYANOCOBALAMIN) 1000 MCG tablet 1,000 mcg. Take two tablets daily.       . vitamin C (ASCORBIC ACID) 500 MG tablet Take 500 mg by mouth daily.      . [DISCONTINUED] tamsulosin (FLOMAX) 0.4 MG CAPS Take 0.4 mg by mouth daily.       No facility-administered encounter medications on file as of 04/13/2013.     Review of Systems  Constitutional: Negative.   HENT: Negative.   Eyes: Negative.   Respiratory: Negative.   Cardiovascular: Negative.   Gastrointestinal: Negative.   Endocrine: Negative.   Musculoskeletal: Negative.   Skin: Positive for color change.  Allergic/Immunologic: Negative.   Neurological: Negative.   Hematological: Negative.  Psychiatric/Behavioral: Negative.   All other systems reviewed and are negative.    BP 108/60  Pulse 69  Ht 5\' 7"  (1.702 m)  Wt 176 lb 12 oz (80.173 kg)  BMI 27.68 kg/m2  Physical Exam  Nursing note and vitals reviewed. Constitutional: He is oriented to person, place, and time. He appears well-developed and well-nourished.  HENT:  Head: Normocephalic.  Nose: Nose normal.  Mouth/Throat: Oropharynx is clear and moist.  Eyes: Conjunctivae are normal. Pupils are equal, round, and reactive to light.  Neck: Normal range of motion. Neck supple. No JVD present.  Cardiovascular: Normal rate, regular rhythm, S1 normal, S2 normal, normal heart sounds and intact distal pulses.  Exam reveals no gallop and no friction rub.   No murmur heard. Pulmonary/Chest: Effort normal and breath sounds normal. No respiratory distress. He has no wheezes. He has no rales. He exhibits  no tenderness.  Abdominal: Soft. Bowel sounds are normal. He exhibits no distension. There is no tenderness.  Musculoskeletal: Normal range of motion. He exhibits no edema and no tenderness.  Lymphadenopathy:    He has no cervical adenopathy.  Neurological: He is alert and oriented to person, place, and time. Coordination normal.  Skin: Skin is warm and dry. No rash noted. No erythema.  Psychiatric: He has a normal mood and affect. His behavior is normal. Judgment and thought content normal.      Assessment and Plan

## 2013-04-13 NOTE — Assessment & Plan Note (Signed)
Currently with no symptoms of angina. No further workup at this time. Continue current medication regimen. 

## 2013-06-12 ENCOUNTER — Other Ambulatory Visit: Payer: Self-pay | Admitting: *Deleted

## 2013-06-12 ENCOUNTER — Other Ambulatory Visit: Payer: Self-pay | Admitting: Cardiovascular Disease

## 2013-06-13 ENCOUNTER — Other Ambulatory Visit: Payer: Self-pay | Admitting: *Deleted

## 2013-06-13 MED ORDER — METOPROLOL TARTRATE 25 MG PO TABS: 25.0000 mg | ORAL_TABLET | Freq: Two times a day (BID) | ORAL | Status: DC

## 2013-06-13 MED ORDER — METOPROLOL TARTRATE 25 MG PO TABS: ORAL_TABLET | ORAL | Status: DC

## 2013-06-13 NOTE — Telephone Encounter (Signed)
Requested Prescriptions   Signed Prescriptions Disp Refills  . metoprolol tartrate (LOPRESSOR) 25 MG tablet 180 tablet 3    Sig: TAKE ONE TABLET BY MOUTH TWICE DAILY    Authorizing Provider: GOLLAN, TIMOTHY J    Ordering User: Lavonda Thal C    

## 2013-10-11 ENCOUNTER — Encounter: Payer: Self-pay | Admitting: Cardiology

## 2013-10-11 ENCOUNTER — Ambulatory Visit (INDEPENDENT_AMBULATORY_CARE_PROVIDER_SITE_OTHER): Payer: Medicare Other | Admitting: Cardiovascular Disease

## 2013-10-11 VITALS — BP 100/60 | HR 67 | Ht 67.0 in | Wt 174.0 lb

## 2013-10-11 DIAGNOSIS — I959 Hypotension, unspecified: Secondary | ICD-10-CM

## 2013-10-11 DIAGNOSIS — I6529 Occlusion and stenosis of unspecified carotid artery: Secondary | ICD-10-CM

## 2013-10-11 DIAGNOSIS — I2510 Atherosclerotic heart disease of native coronary artery without angina pectoris: Secondary | ICD-10-CM

## 2013-10-11 DIAGNOSIS — R0602 Shortness of breath: Secondary | ICD-10-CM

## 2013-10-11 DIAGNOSIS — E785 Hyperlipidemia, unspecified: Secondary | ICD-10-CM

## 2013-10-11 DIAGNOSIS — Z951 Presence of aortocoronary bypass graft: Secondary | ICD-10-CM

## 2013-10-11 NOTE — Assessment & Plan Note (Signed)
No recent lipid panel available. All labs done at the [facility]

## 2013-10-11 NOTE — Patient Instructions (Signed)
You are doing well. No medication changes were made.  Please call us if you have new issues that need to be addressed before your next appt.  Your physician wants you to follow-up in: 6 months.  You will receive a reminder letter in the mail two months in advance. If you don't receive a letter, please call our office to schedule the follow-up appointment.   

## 2013-10-11 NOTE — Assessment & Plan Note (Signed)
Status post CEA on the left, moderate disease on the right

## 2013-10-11 NOTE — Assessment & Plan Note (Signed)
Currently with no symptoms of angina. No further workup at this time. Continue current medication regimen. 

## 2013-10-11 NOTE — Assessment & Plan Note (Signed)
Recommended fluids and salt as a part of his regular diet. If he continues to have episodes of dizziness, we could decrease the metoprolol down to 12.5 mg twice a day. Currently he takes 25 mg in the morning, 12.5 mg in the evening

## 2013-10-11 NOTE — Progress Notes (Signed)
Patient ID: [name] [name], male    DOB: 1935-07-29, 78 y.o.   MRN: [id number]  HPI Comments: Mr. [name] [name] is a 78 year old gentleman with a hx of smoking, diabetes,  non-ST elevation MI and was brought to the cardiac catheterization lab showing  severe CAD, including LAD, ostial diagonal disease x2 and he was referred for bypass surgery. He had a CABG x5 with a carotid endarterectomy on the left simultaneously with Dr. Morton PetersVan [name] and Dr. Arbie [name] [facility]. He presents for routine followup. He has been treated on prednisone in the past for suspected sarcoid. He had pneumonia earlier in 2012. He did smoke for many years but stopped at age 78.    In followup today, he has rare episodes of dizziness when going from a squatting or bending over position to standing position. Blood pressure typically runs low. He is [facility] where he receives most of his medications  He reports that overall he has been doing well. He denies any significant shortness of breath or chest pain.  Previous history of chronic shoulder pain.  Active, does not do any regular exercise.  No recent lab work available here. Has had trouble with urinary tract infections.  Carotid U/S:  severe disease of the left internal carotid artery estimated at 80-99%, now s/p CEA Most recent carotid ultrasound showed 40-59% disease, right greater than left  EKG shows normal sinus rhythm with rate 67 beats per minute with no significant ST or T wave changes   Outpatient Encounter Prescriptions as of 10/11/2013  Medication Sig  . acetaminophen (TYLENOL) 325 MG tablet Take 325 mg by mouth every 6 (six) hours as needed.  Marland Kitchen. aspirin 81 MG tablet Take 81 mg by mouth daily.  . Cholecalciferol (VITAMIN D3) 2000 UNITS TABS Take 2,000 Units by mouth daily.    Marland Kitchen. docusate sodium (COLACE) 100 MG capsule Take 100 mg by mouth daily.    . fluticasone (FLONASE) 50 MCG/ACT nasal spray Place 1 spray into both nostrils daily.   .  furosemide (LASIX) 40 MG tablet Take 40 mg by mouth daily. As needed.   Marland Kitchen. HYDROXYZINE HCL PO Take 10 mg by mouth 2 (two) times daily. As needed    . Magnesium 200 MG TABS Take 200 mg by mouth daily.  . metoprolol tartrate (LOPRESSOR) 25 MG tablet Take 1 tablet (25 mg total) by mouth 2 (two) times daily.  Marland Kitchen. omeprazole (PRILOSEC) 20 MG capsule Take 40 mg by mouth 2 (two) times daily.   Marland Kitchen. PROAIR HFA 108 (90 BASE) MCG/ACT inhaler Inhale 2 puffs into the lungs every 6 (six) hours as needed.   Marland Kitchen. rOPINIRole (REQUIP) 0.5 MG tablet 0.5 mg. At bedtime.   . rosuvastatin (CRESTOR) 10 MG tablet Take 5 mg by mouth daily.   Marland Kitchen. Specialty Vitamins Products ([employer]S PROSTATE HEALTH FORMULA PO) Take 1 capsule by mouth daily.    . vitamin B-12 (CYANOCOBALAMIN) 1000 MCG tablet 1,000 mcg. Take two tablets daily.   . vitamin C (ASCORBIC ACID) 500 MG tablet Take 500 mg by mouth daily.    Review of Systems  Constitutional: Negative.   HENT: Negative.   Eyes: Negative.   Respiratory: Negative.   Cardiovascular: Negative.   Gastrointestinal: Negative.   Endocrine: Negative.   Musculoskeletal: Negative.   Skin: Positive for color change.  Allergic/Immunologic: Negative.   Neurological: Positive for dizziness.  Hematological: Negative.   Psychiatric/Behavioral: Negative.   All other systems reviewed and are negative.  BP 100/60  Pulse 67  Ht 5\' 7"  (1.702 m)  Wt 174 lb (78.926 kg)  BMI 27.25 kg/m2  Physical Exam  Nursing note and vitals reviewed. Constitutional: He is oriented to person, place, and time. He appears well-developed and well-nourished.  HENT:  Head: Normocephalic.  Nose: Nose normal.  Mouth/Throat: Oropharynx is clear and moist.  Eyes: Conjunctivae are normal. Pupils are equal, round, and reactive to light.  Neck: Normal range of motion. Neck supple. No JVD present.  Cardiovascular: Normal rate, regular rhythm, S1 normal, S2 normal, normal heart sounds and intact distal pulses.  Exam  reveals no gallop and no friction rub.   No murmur heard. Pulmonary/Chest: Effort normal and breath sounds normal. No respiratory distress. He has no wheezes. He has no rales. He exhibits no tenderness.  Abdominal: Soft. Bowel sounds are normal. He exhibits no distension. There is no tenderness.  Musculoskeletal: Normal range of motion. He exhibits no edema and no tenderness.  Lymphadenopathy:    He has no cervical adenopathy.  Neurological: He is alert and oriented to person, place, and time. Coordination normal.  Skin: Skin is warm and dry. No rash noted. No erythema.  Psychiatric: He has a normal mood and affect. His behavior is normal. Judgment and thought content normal.      Assessment and Plan

## 2013-10-16 ENCOUNTER — Telehealth: Payer: Self-pay

## 2013-10-16 NOTE — Telephone Encounter (Signed)
Pt called back, states dr Mariah MillingGollan wanted

## 2013-11-02 ENCOUNTER — Other Ambulatory Visit (HOSPITAL_COMMUNITY): Payer: Self-pay | Admitting: *Deleted

## 2013-11-02 DIAGNOSIS — I6529 Occlusion and stenosis of unspecified carotid artery: Secondary | ICD-10-CM

## 2013-11-16 ENCOUNTER — Telehealth: Payer: Self-pay

## 2013-11-16 ENCOUNTER — Encounter (INDEPENDENT_AMBULATORY_CARE_PROVIDER_SITE_OTHER): Payer: Medicare Other

## 2013-11-16 DIAGNOSIS — I6529 Occlusion and stenosis of unspecified carotid artery: Secondary | ICD-10-CM

## 2013-11-16 NOTE — Telephone Encounter (Signed)
Pt presented to office today for carotid u/s. I was notified that pt's BP is low at 90/50. Spoke w/ pt.  He reports that is BP is low every am.  Advised pt that per Dr. Windell Hummingbird last ov, he was advised to cut his metoprolol in 1/2 BID to 12.5mg  if BP remained low.  Advised pt to go ahead and cut in 1/2 and monitor BP for a week. Pt is agreeable and will call next week w/ readings.

## 2013-11-21 ENCOUNTER — Telehealth: Payer: Self-pay

## 2013-11-21 NOTE — Telephone Encounter (Signed)
Pt called and states that cutting back to 1/2 metoprolol is helping, states his BP is 120/70 (average)

## 2013-11-21 NOTE — Telephone Encounter (Signed)
That is fine 

## 2013-11-23 NOTE — Telephone Encounter (Signed)
Informed patient of Dr. Aridas response  Patient verbalized understanding  

## 2014-04-09 ENCOUNTER — Encounter: Payer: Self-pay | Admitting: Cardiovascular Disease

## 2014-04-09 ENCOUNTER — Ambulatory Visit (INDEPENDENT_AMBULATORY_CARE_PROVIDER_SITE_OTHER): Payer: Medicare Other | Admitting: Cardiovascular Disease

## 2014-04-09 VITALS — BP 98/60 | HR 68 | Ht 67.0 in | Wt 180.5 lb

## 2014-04-09 DIAGNOSIS — Z951 Presence of aortocoronary bypass graft: Secondary | ICD-10-CM

## 2014-04-09 DIAGNOSIS — I25810 Atherosclerosis of coronary artery bypass graft(s) without angina pectoris: Secondary | ICD-10-CM

## 2014-04-09 DIAGNOSIS — I959 Hypotension, unspecified: Secondary | ICD-10-CM

## 2014-04-09 DIAGNOSIS — I6523 Occlusion and stenosis of bilateral carotid arteries: Secondary | ICD-10-CM

## 2014-04-09 DIAGNOSIS — E785 Hyperlipidemia, unspecified: Secondary | ICD-10-CM

## 2014-04-09 DIAGNOSIS — R252 Cramp and spasm: Secondary | ICD-10-CM

## 2014-04-09 NOTE — Patient Instructions (Addendum)
You are doing well.  Please hold the atorvastatin to see if cramping gets better If no improvement in 3 to 4 weeks, go back on the pill If cramping gets better, call the office   Please call us if you have new issues that need to be addressed before your next appt.  Your physician wants you to follow-up in: 6 months.  You will receive a reminder letter in the mail two months in advance. If you don't receive a letter, please call our office to schedule the follow-up appointment.  Your next appointment will be scheduled in our new office located at :  [facility]  [address], [address]  [place], [place] [id number]

## 2014-04-09 NOTE — Assessment & Plan Note (Signed)
History of carotid endarterectomy. Recent ultrasound showing 40-59% disease. Stable

## 2014-04-09 NOTE — Assessment & Plan Note (Signed)
Cholesterol is at goal on the current lipid regimen. No changes to the medications were made.  

## 2014-04-09 NOTE — Assessment & Plan Note (Signed)
Etiology unclear. Recommended he hold his cholesterol pill for several weeks to see if this helps his symptoms. Could change back to Crestor

## 2014-04-09 NOTE — Assessment & Plan Note (Signed)
Blood pressure continues to run low. Rare episodes of lightheadedness. He is drinking more fluids

## 2014-04-09 NOTE — Progress Notes (Signed)
Patient ID: [name] [name], male    DOB: April 20, 1936, 78 y.o.   MRN: [id number]  HPI Comments: [name] [name] is a 78 year old gentleman with a hx of smoking, diabetes,  non-ST elevation MI and was brought to the cardiac catheterization lab showing  severe CAD, including LAD, ostial diagonal disease x2 and he was referred for bypass surgery. He had a CABG x5 in 2011 with a carotid endarterectomy on the left simultaneously with Dr. Morton PetersVan Tright and Dr. Arbie Co[facility]keyEarly [facility]. He presents for routine followup.  treated on prednisone in the past for suspected sarcoid. He had pneumonia earlier in 2012. He did smoke for many years but stopped at age 78. Previous trouble with urinary tract infections    In the past, he has rare episodes of dizziness when going from a squatting or bending over position to standing position. He decrease the metoprolol dose down to 12.5 mg twice a day. Blood pressure typically runs low. [facility] he [facility] most of his medications  On today's visit, He denies any significant shortness of breath or chest pain.  Previous history of chronic shoulder pain.  Active, does not do any regular exercise.  No recent lab work available here. He reports his sugars have been running high Prior LDL 78 HDL 50 He was previously on Crestor. This was [place] [facility] he tries to drink more fluids for his low blood pressure. Drinking diet power aid  Carotid U/S:  severe disease of the left internal carotid artery estimated at 80-99%, now s/p CEA Most recent carotid ultrasound showed 40-59% disease,  right  EKG shows normal sinus rhythm with rate 68 beats per minute with no significant ST or T wave changes   Outpatient Encounter Prescriptions as of 04/09/2014  Medication Sig  . acetaminophen (TYLENOL) 325 MG tablet Take 325 mg by mouth every 6 (six) hours as needed.  Marland Kitchen. aspirin 81 MG tablet Take 81 mg by mouth daily.  Marland Kitchen. atorvastatin (LIPITOR) 20 MG  tablet Take 10 mg by mouth daily.  . Cholecalciferol (VITAMIN D3) 2000 UNITS TABS Take 2,000 Units by mouth daily.    . fluticasone (FLONASE) 50 MCG/ACT nasal spray Place 1 spray into both nostrils as needed.   Marland Kitchen. HYDROXYZINE HCL PO Take 10 mg by mouth 2 (two) times daily. As needed    . insulin glargine (LANTUS) 100 UNIT/ML injection Inject 45 Units into the skin at bedtime.  . insulin regular (NOVOLIN R,HUMULIN R) 100 units/mL injection Inject 25 Units into the skin 3 (three) times daily before meals.   . Magnesium 200 MG TABS Take 200 mg by mouth daily.  . metoprolol tartrate (LOPRESSOR) 25 MG tablet Take 1 tablet (25 mg total) by mouth 2 (two) times daily.  Marland Kitchen. omeprazole (PRILOSEC) 20 MG capsule Take 40 mg by mouth 2 (two) times daily.   Marland Kitchen. PROAIR HFA 108 (90 BASE) MCG/ACT inhaler Inhale 2 puffs into the lungs every 6 (six) hours as needed.   Marland Kitchen. rOPINIRole (REQUIP) 1 MG tablet Take 1 mg by mouth at bedtime.  Marland Kitchen. Specialty Vitamins Products ([employer] PROSTATE HEALTH FORMULA PO) Take 1 capsule by mouth daily.    . tamsulosin (FLOMAX) 0.4 MG CAPS capsule Take 0.4 mg by mouth daily.  . vitamin B-12 (CYANOCOBALAMIN) 1000 MCG tablet Take 1,000 mcg by mouth daily.   . vitamin C (ASCORBIC ACID) 500 MG tablet Take 500 mg by mouth daily.    Review  of Systems  Constitutional: Negative.   HENT: Negative.   Eyes: Negative.   Respiratory: Negative.   Cardiovascular: Negative.   Gastrointestinal: Negative.   Endocrine: Negative.   Musculoskeletal: Positive for arthralgias.  Skin: Negative.   Allergic/Immunologic: Negative.   Neurological: Negative.   Hematological: Negative.   Psychiatric/Behavioral: Negative.   All other systems reviewed and are negative.   BP 98/60  Pulse 68  Ht 5\' 7"  (1.702 m)  Wt 180 lb 8 oz (81.874 kg)  BMI 28.26 kg/m2  Physical Exam  Nursing note and vitals reviewed. Constitutional: He is oriented to person, place, and time. He appears well-developed and well-nourished.   HENT:  Head: Normocephalic.  Nose: Nose normal.  Mouth/Throat: Oropharynx is clear and moist.  Eyes: Conjunctivae are normal. Pupils are equal, round, and reactive to light.  Neck: Normal range of motion. Neck supple. No JVD present.  Cardiovascular: Normal rate, regular rhythm, S1 normal, S2 normal, normal heart sounds and intact distal pulses.  Exam reveals no gallop and no friction rub.   No murmur heard. Pulmonary/Chest: Effort normal and breath sounds normal. No respiratory distress. He has no wheezes. He has no rales. He exhibits no tenderness.  Abdominal: Soft. Bowel sounds are normal. He exhibits no distension. There is no tenderness.  Musculoskeletal: Normal range of motion. He exhibits no edema and no tenderness.  Lymphadenopathy:    He has no cervical adenopathy.  Neurological: He is alert and oriented to person, place, and time. Coordination normal.  Skin: Skin is warm and dry. No rash noted. No erythema.  Psychiatric: He has a normal mood and affect. His behavior is normal. Judgment and thought content normal.      Assessment and Plan

## 2014-04-09 NOTE — Assessment & Plan Note (Signed)
Currently with no symptoms of angina. No further workup at this time. Continue current medication regimen. 

## 2014-08-27 ENCOUNTER — Other Ambulatory Visit: Payer: Self-pay | Admitting: Cardiovascular Disease

## 2014-09-11 ENCOUNTER — Telehealth: Payer: Self-pay | Admitting: *Deleted

## 2014-09-11 MED ORDER — METOPROLOL TARTRATE 25 MG PO TABS: 25.0000 mg | ORAL_TABLET | Freq: Two times a day (BID) | ORAL | Status: DC

## 2014-09-11 NOTE — Telephone Encounter (Signed)
Refill sent for metoprolol.  

## 2014-09-11 NOTE — Telephone Encounter (Signed)
°  1. Which medications need to be refilled? metoprolol   2. Which pharmacy is medication to be sent to? [facility] on [address] in [place]   3. Do they need a 30 day or 90 day supply? 180 was the last amt given   4. Would they like a call back once the medication has been sent to the pharmacy? No

## 2014-10-08 ENCOUNTER — Ambulatory Visit (INDEPENDENT_AMBULATORY_CARE_PROVIDER_SITE_OTHER): Payer: Medicare Other | Admitting: Cardiovascular Disease

## 2014-10-08 ENCOUNTER — Encounter: Payer: Self-pay | Admitting: Cardiovascular Disease

## 2014-10-08 VITALS — BP 104/58 | HR 64 | Ht 67.0 in | Wt 180.0 lb

## 2014-10-08 DIAGNOSIS — E118 Type 2 diabetes mellitus with unspecified complications: Secondary | ICD-10-CM | POA: Diagnosis not present

## 2014-10-08 DIAGNOSIS — I25810 Atherosclerosis of coronary artery bypass graft(s) without angina pectoris: Secondary | ICD-10-CM | POA: Diagnosis not present

## 2014-10-08 DIAGNOSIS — E785 Hyperlipidemia, unspecified: Secondary | ICD-10-CM | POA: Diagnosis not present

## 2014-10-08 DIAGNOSIS — Z9181 History of falling: Secondary | ICD-10-CM | POA: Diagnosis not present

## 2014-10-08 DIAGNOSIS — E1165 Type 2 diabetes mellitus with hyperglycemia: Secondary | ICD-10-CM

## 2014-10-08 DIAGNOSIS — IMO0002 Reserved for concepts with insufficient information to code with codable children: Secondary | ICD-10-CM | POA: Insufficient documentation

## 2014-10-08 MED ORDER — METOPROLOL TARTRATE 25 MG PO TABS: 25.0000 mg | ORAL_TABLET | Freq: Two times a day (BID) | ORAL | Status: DC

## 2014-10-08 NOTE — Patient Instructions (Signed)
You are doing well. No medication changes were made.  Please call us if you have new issues that need to be addressed before your next appt.  Your physician wants you to follow-up in: 6 months.  You will receive a reminder letter in the mail two months in advance. If you don't receive a letter, please call our office to schedule the follow-up appointment.   

## 2014-10-08 NOTE — Assessment & Plan Note (Signed)
Stress to him the importance of follow-up with his physicians. He is missing insulin when he feels it is running low. We gave him a dietary guide to follow, low carbohydrate

## 2014-10-08 NOTE — Assessment & Plan Note (Signed)
Currently with no symptoms of angina. No further workup at this time. Continue current medication regimen. 

## 2014-10-08 NOTE — Assessment & Plan Note (Signed)
Recently fell with head concussion, knee injury, broken ribs. Stress to him the importance of daily walking for balance. He has difficulty climbing stairs

## 2014-10-08 NOTE — Progress Notes (Signed)
Patient ID: Kerman PasseyMaynard W Troup, male    DOB: Oct 24, 1935, 79 y.o.   MRN: [id number]  HPI Comments: [name] is a 79 year old gentleman with a hx of smoking, diabetes,  non-ST elevation MI and was brought to the cardiac catheterization lab showing  severe CAD, including LAD, ostial diagonal disease x2 and he was referred for bypass surgery. He had a CABG x5 in 2011 with a carotid endarterectomy on the left simultaneously with Dr. Morton PetersVan Tright and Dr. Ar[facility]keyEarly at [facility]. He presents for routine followup of his coronary artery disease.  treated on prednisone in the past for suspected sarcoid. He had pneumonia earlier in 2012. He did smoke for many years but stopped at age 79. Previous trouble with urinary tract infections   In follow-up today, he reports that he had a fall in February 2016. He hurt his head, knee, broke several ribs This was a mechanical fall, tripped over a wire, taking care of his dogs Reports his sugars continue to run high, working with endocrine. Sometimes misses his insulin when it is running lower. He is afraid his sugars will drop. Total cholesterol 144 No regular exercise program though stays active  EKG shows normal sinus rhythm with rate 71 bpm, old inferior MI   Other past medical history In the past, he has rare episodes of dizziness when going from a squatting or bending over position to standing position. He decrease the metoprolol dose down to 12.5 mg twice a day. Blood pressure typically runs low. [facility] where he receives most of his medications  Carotid U/S:  severe disease of the left internal carotid artery estimated at 80-99%, now s/p CEA Most recent carotid ultrasound showed 40-59% disease,  right   No Known Allergies  Outpatient Encounter Prescriptions as of 10/08/2014  Medication Sig  . acetaminophen (TYLENOL) 325 MG tablet Take 325 mg by mouth every 6 (six) hours as needed.  Marland Kitchen. aspirin 81 MG tablet Take 81 mg by mouth daily.   Marland Kitchen. atorvastatin (LIPITOR) 20 MG tablet Take 10 mg by mouth daily.  . Cholecalciferol (VITAMIN D3) 2000 UNITS TABS Take 2,000 Units by mouth daily.    . fluticasone (FLONASE) 50 MCG/ACT nasal spray Place 1 spray into both nostrils as needed.   Marland Kitchen. HYDROXYZINE HCL PO Take 10 mg by mouth 2 (two) times daily. As needed    . insulin glargine (LANTUS) 100 UNIT/ML injection Inject 45 Units into the skin at bedtime.  . insulin NPH Human (HUMULIN N,NOVOLIN N) 100 UNIT/ML injection Inject 34 Units into the skin 2 (two) times daily before a meal.  . Magnesium 200 MG TABS Take 200 mg by mouth daily.  . metoprolol tartrate (LOPRESSOR) 25 MG tablet Take 1 tablet (25 mg total) by mouth 2 (two) times daily.  Marland Kitchen. omeprazole (PRILOSEC) 20 MG capsule Take 40 mg by mouth 2 (two) times daily.   Marland Kitchen. PROAIR HFA 108 (90 BASE) MCG/ACT inhaler Inhale 2 puffs into the lungs every 6 (six) hours as needed.   Marland Kitchen. rOPINIRole (REQUIP) 1 MG tablet Take 1 mg by mouth at bedtime.  Marland Kitchen. Specialty Vitamins Products ([employer]S PROSTATE HEALTH FORMULA PO) Take 1 capsule by mouth daily.    . tamsulosin (FLOMAX) 0.4 MG CAPS capsule Take 0.4 mg by mouth daily.  . vitamin C (ASCORBIC ACID) 500 MG tablet Take 500 mg by mouth daily.  . [DISCONTINUED] insulin regular (NOVOLIN R,HUMULIN R) 100 units/mL injection Inject 25 Units into the skin  3 (three) times daily before meals.   . [DISCONTINUED] vitamin B-12 (CYANOCOBALAMIN) 1000 MCG tablet Take 1,000 mcg by mouth daily.     Past Medical History  Diagnosis Date  . Coronary artery disease   . Carotid artery occlusion   . Diabetes mellitus   . Hyperlipidemia   . Hypertension     Past Surgical History  Procedure Laterality Date  . Coronary artery bypass graft      x 5 @ [facility]  . Carotid endarterectomy    . Cardiac catheterization    . Knee arthroplasty      bilateral    Social History  reports that he has quit smoking. His smoking use included Cigarettes. He smoked 1.00 pack per day. He  quit smokeless tobacco use about 25 years ago. He reports that he does not drink alcohol or use illicit drugs.  Family History Family history is unknown by patient.       Review of Systems  Constitutional: Negative.   Respiratory: Negative.   Cardiovascular: Negative.   Gastrointestinal: Negative.   Musculoskeletal: Positive for arthralgias and gait problem.  Skin: Negative.   Neurological: Negative.   Hematological: Negative.   Psychiatric/Behavioral: Negative.   All other systems reviewed and are negative.   BP 104/58 mmHg  Pulse 64  Ht  (1.702 m)  Wt 180 lb (81.647 kg)  BMI 28.19 kg/m2  Physical Exam  Constitutional: He is oriented to person, place, and time. He appears well-developed and well-nourished.  HENT:  Head: Normocephalic.  Nose: Nose normal.  Mouth/Throat: Oropharynx is clear and moist.  Eyes: Conjunctivae are normal. Pupils are equal, round, and reactive to light.  Neck: Normal range of motion. Neck supple. No JVD present.  Cardiovascular: Normal rate, regular rhythm, S1 normal, S2 normal, normal heart sounds and intact distal pulses.  Exam reveals no gallop and no friction rub.   No murmur heard. Pulmonary/Chest: Effort normal and breath sounds normal. No respiratory distress. He has no wheezes. He has no rales. He exhibits no tenderness.  Abdominal: Soft. Bowel sounds are normal. He exhibits no distension. There is no tenderness.  Musculoskeletal: Normal range of motion. He exhibits no edema or tenderness.  Lymphadenopathy:    He has no cervical adenopathy.  Neurological: He is alert and oriented to person, place, and time. Coordination normal.  Skin: Skin is warm and dry. No rash noted. No erythema.  Psychiatric: He has a normal mood and affect. His behavior is normal. Judgment and thought content normal.      Assessment and Plan   Nursing note and vitals reviewed.

## 2014-10-08 NOTE — Assessment & Plan Note (Signed)
Cholesterol is at goal on the current lipid regimen. No changes to the medications were made.  

## 2015-04-08 ENCOUNTER — Encounter: Payer: Self-pay | Admitting: Cardiovascular Disease

## 2015-04-08 ENCOUNTER — Ambulatory Visit (INDEPENDENT_AMBULATORY_CARE_PROVIDER_SITE_OTHER): Payer: Medicare Other | Admitting: Cardiovascular Disease

## 2015-04-08 VITALS — BP 100/58 | HR 78 | Ht 69.0 in | Wt 180.5 lb

## 2015-04-08 DIAGNOSIS — E118 Type 2 diabetes mellitus with unspecified complications: Secondary | ICD-10-CM

## 2015-04-08 DIAGNOSIS — I6523 Occlusion and stenosis of bilateral carotid arteries: Secondary | ICD-10-CM

## 2015-04-08 DIAGNOSIS — IMO0002 Reserved for concepts with insufficient information to code with codable children: Secondary | ICD-10-CM

## 2015-04-08 DIAGNOSIS — E1165 Type 2 diabetes mellitus with hyperglycemia: Secondary | ICD-10-CM

## 2015-04-08 DIAGNOSIS — Z794 Long term (current) use of insulin: Secondary | ICD-10-CM

## 2015-04-08 DIAGNOSIS — D869 Sarcoidosis, unspecified: Secondary | ICD-10-CM | POA: Diagnosis not present

## 2015-04-08 DIAGNOSIS — I25810 Atherosclerosis of coronary artery bypass graft(s) without angina pectoris: Secondary | ICD-10-CM | POA: Diagnosis not present

## 2015-04-08 DIAGNOSIS — E785 Hyperlipidemia, unspecified: Secondary | ICD-10-CM

## 2015-04-08 NOTE — Progress Notes (Signed)
Patient ID: [name] [name], male    DOB: 1935-07-23, 79 y.o.   MRN: [id number]  HPI Comments: [name] [name] is a 79 year old gentleman with a hx of smoking, diabetes,  non-ST elevation MI and was brought to the cardiac catheterization lab showing  severe CAD, including LAD, ostial diagonal disease x2 and he was referred for bypass surgery. He had a CABG x5 in 2011 with a carotid endarterectomy on the left simultaneously with Dr. Morton Peters and Dr. Arbie Cookey at [facility]. He presents for routine followup of his coronary artery disease.  treated on prednisone in the past for suspected sarcoid. He had pneumonia earlier in 2012. He did smoke for many years but stopped at age 47. Previous trouble with urinary tract infections  40-59% carotid disease on the right in 2015  In follow-up today, he reports that he is doing well. He is trying to work on his diabetes numbers, managed by the [facility] Currently on insulin 3 times per day Reports his blood pressures typically 115 up to 120 systolic at home He has been taking metoprolol one half pill twice a day Prior lab work from the [place] shows total cholesterol 144, HDL 50, in 2014. No recent labs available No regular exercise program though stays active  EKG on today's visit shows normal sinus rhythm with rate 77 bpm, unable to screen old inferior MI  Other past medical history fall in February 2016. He hurt his head, knee, broke several ribs This was a mechanical fall, tripped over a wire, taking care of his dogs  In the past, he has rare episodes of dizziness when going from a squatting or bending over position to standing position. He decrease the metoprolol dose down to 12.5 mg twice a day. Blood pressure typically runs low. He is followed by the [facility] where he receives most of his medications  Carotid U/S:  severe disease of the left internal carotid artery estimated at 80-99%, now s/p CEA Most recent carotid ultrasound showed 40-59% disease,   right   No Known Allergies  Outpatient Encounter Prescriptions as of 04/08/2015  Medication Sig  . acetaminophen (TYLENOL) 325 MG tablet Take 325 mg by mouth every 6 (six) hours as needed.  Marland Kitchen aspirin 81 MG tablet Take 81 mg by mouth daily.  Marland Kitchen atorvastatin (LIPITOR) 20 MG tablet Take 10 mg by mouth daily.  . Cholecalciferol (VITAMIN D3) 2000 UNITS TABS Take 2,000 Units by mouth daily.    . fluticasone (FLONASE) 50 MCG/ACT nasal spray Place 1 spray into both nostrils as needed.   . gabapentin (NEURONTIN) 300 MG capsule Take 300 mg by mouth 2 (two) times daily.  Marland Kitchen HYDROXYZINE HCL PO Take 10 mg by mouth 2 (two) times daily. As needed    . insulin glargine (LANTUS) 100 UNIT/ML injection Inject 45 Units into the skin at bedtime.  . insulin NPH Human (HUMULIN N,NOVOLIN N) 100 UNIT/ML injection Inject 34 Units into the skin 2 (two) times daily before a meal.  . Magnesium 200 MG TABS Take 200 mg by mouth daily.  . methocarbamol (ROBAXIN) 500 MG tablet Take 500 mg by mouth at bedtime.  . metoprolol tartrate (LOPRESSOR) 25 MG tablet Take 1 tablet (25 mg total) by mouth 2 (two) times daily.  Marland Kitchen omeprazole (PRILOSEC) 20 MG capsule Take 40 mg by mouth 2 (two) times daily.   Marland Kitchen PROAIR HFA 108 (90 BASE) MCG/ACT inhaler Inhale 2 puffs into the lungs every 6 (six) hours as needed.   Marland Kitchen  rOPINIRole (REQUIP) 1 MG tablet Take 1 mg by mouth at bedtime.  Marland Kitchen. Specialty Vitamins Products ([employer]S PROSTATE HEALTH FORMULA PO) Take 1 capsule by mouth daily.    . tamsulosin (FLOMAX) 0.4 MG CAPS capsule Take 0.4 mg by mouth daily.  . vitamin C (ASCORBIC ACID) 500 MG tablet Take 500 mg by mouth daily.   No facility-administered encounter medications on file as of 04/08/2015.    Past Medical History  Diagnosis Date  . Coronary artery disease   . Carotid artery occlusion   . Diabetes mellitus   . Hyperlipidemia   . Hypertension     Past Surgical History  Procedure Laterality Date  . Coronary artery bypass graft       x 5 @ [facility]  . Carotid endarterectomy    . Cardiac catheterization    . Knee arthroplasty      bilateral    Social History  reports that he has quit smoking. His smoking use included Cigarettes. He smoked 1.00 pack per day. He quit smokeless tobacco use about 26 years ago. He reports that he does not drink alcohol or use illicit drugs.  Family History Family history is unknown by patient.       Review of Systems  Constitutional: Negative.   Respiratory: Negative.   Cardiovascular: Negative.   Gastrointestinal: Negative.   Musculoskeletal: Positive for arthralgias and gait problem.  Skin: Negative.   Neurological: Negative.   Hematological: Negative.   Psychiatric/Behavioral: Negative.   All other systems reviewed and are negative.   BP 100/58 mmHg  Pulse 78  Ht 5\' 9"  (1.753 m)  Wt 180 lb 8 oz (81.874 kg)  BMI 26.64 kg/m2  Physical Exam  Constitutional: He is oriented to person, place, and time. He appears well-developed and well-nourished.  HENT:  Head: Normocephalic.  Nose: Nose normal.  Mouth/Throat: Oropharynx is clear and moist.  Eyes: Conjunctivae are normal. Pupils are equal, round, and reactive to light.  Neck: Normal range of motion. Neck supple. No JVD present.  Cardiovascular: Normal rate, regular rhythm, S1 normal, S2 normal, normal heart sounds and intact distal pulses.  Exam reveals no gallop and no friction rub.   No murmur heard. Pulmonary/Chest: Effort normal and breath sounds normal. No respiratory distress. He has no wheezes. He has no rales. He exhibits no tenderness.  Abdominal: Soft. Bowel sounds are normal. He exhibits no distension. There is no tenderness.  Musculoskeletal: Normal range of motion. He exhibits no edema or tenderness.  Lymphadenopathy:    He has no cervical adenopathy.  Neurological: He is alert and oriented to person, place, and time. Coordination normal.  Skin: Skin is warm and dry. No rash noted. No erythema.   Psychiatric: He has a normal mood and affect. His behavior is normal. Judgment and thought content normal.      Assessment and Plan   Nursing note and vitals reviewed.

## 2015-04-08 NOTE — Assessment & Plan Note (Signed)
Clinically stable, previously on prednisone

## 2015-04-08 NOTE — Assessment & Plan Note (Signed)
Currently with no symptoms of angina. No further workup at this time. Continue current medication regimen. 

## 2015-04-08 NOTE — Assessment & Plan Note (Signed)
We have encouraged continued exercise, careful diet management in an effort to lose weight. Managed medically [facility]

## 2015-04-08 NOTE — Assessment & Plan Note (Signed)
Stable 40-59% disease on the right in 2014 and 2015. Repeat next year

## 2015-04-08 NOTE — Patient Instructions (Addendum)
You are doing well. No medication changes were made.  Please call us if you have new issues that need to be addressed before your next appt.  Your physician wants you to follow-up in: 6 months.  You will receive a reminder letter in the mail two months in advance. If you don't receive a letter, please call our office to schedule the follow-up appointment.   

## 2015-04-08 NOTE — Assessment & Plan Note (Signed)
Cholesterol is at goal on the current lipid regimen. No changes to the medications were made. [facility]

## 2015-07-31 ENCOUNTER — Telehealth: Payer: Self-pay | Admitting: Cardiovascular Disease

## 2015-07-31 ENCOUNTER — Other Ambulatory Visit: Payer: Self-pay | Admitting: *Deleted

## 2015-07-31 MED ORDER — METOPROLOL TARTRATE 25 MG PO TABS: 25.0000 mg | ORAL_TABLET | Freq: Two times a day (BID) | ORAL | Status: AC

## 2015-07-31 NOTE — Telephone Encounter (Signed)
Rx Faxed for Metoprolol 25 mg #180 R#3 to [facility] pharmacy.

## 2015-07-31 NOTE — Telephone Encounter (Signed)
Requested Prescriptions   Signed Prescriptions Disp Refills   metoprolol tartrate (LOPRESSOR) 25 MG tablet 180 tablet 3    Sig: Take 1 tablet (25 mg total) by mouth 2 (two) times daily.    Authorizing Provider: GOLLAN, TIMOTHY J    Ordering User: LOPEZ, MARINA C    

## 2015-07-31 NOTE — Telephone Encounter (Signed)
Pt calling stating if we can send over a refill to [place]  Metoprolol 25 mg For it would be cheaper for the patient.  Fax to: [phone]

## 2015-11-19 ENCOUNTER — Telehealth: Payer: Self-pay | Admitting: Cardiovascular Disease

## 2015-11-19 NOTE — Telephone Encounter (Signed)
Attempted to schedule fu from recall.  Patient does not wish to fu as insurance is out of network and he has a new provider in [place].  Deleting recall.

## 2022-08-14 DEATH — deceased
